# Patient Record
Sex: Male | Born: 1980 | Race: Asian | Hispanic: No | Marital: Married | Smoking: Never smoker
Health system: Southern US, Community
[De-identification: ages and names within clinical notes are randomized; demographics above are authoritative.]

## PROBLEM LIST (undated history)

## (undated) ENCOUNTER — Ambulatory Visit (HOSPITAL_COMMUNITY)

## (undated) DIAGNOSIS — K5900 Constipation, unspecified: Secondary | ICD-10-CM

---

## 2005-08-01 ENCOUNTER — Emergency Department (HOSPITAL_COMMUNITY): Admission: EM | Admit: 2005-08-01 | Discharge: 2005-08-01 | Payer: Self-pay | Admitting: Emergency Medicine

## 2014-04-29 ENCOUNTER — Ambulatory Visit (INDEPENDENT_AMBULATORY_CARE_PROVIDER_SITE_OTHER): Payer: BLUE CROSS/BLUE SHIELD | Admitting: Family Medicine

## 2014-04-29 VITALS — BP 118/80 | HR 56 | Temp 97.8°F | Resp 16 | Ht 64.25 in | Wt 162.2 lb

## 2014-04-29 DIAGNOSIS — L910 Hypertrophic scar: Secondary | ICD-10-CM

## 2014-04-29 NOTE — Patient Instructions (Signed)
I will refer you to a dermatologist for evaluation and treatment options for Keloids. See the handout for more information on this condition.  Return to the clinic or go to the nearest emergency room if any of your symptoms worsen or new symptoms occur.

## 2014-04-29 NOTE — Progress Notes (Addendum)
Subjective:   This chart was scribed for Meredith StaggersJeffrey Dwight Burdo, MD by Jarvis Morganaylor Ferguson, Medical Scribe. This patient was seen in Room 1 and the patient's care was started at 11:58 AM.   Patient ID: Michael DouglasGuh Stockman, male    DOB: 11/15/1980, 34 y.o.   MRN: [id number]  Chief Complaint  Patient presents with  . Growth on Both Shoulders    HPI   Michael Frank is a 34 y.o. male who presents to [facility] due to red, raised, painful bumps on his bilateral shoulders for over 2 years. He states that it never goes away and has been gradually worsening and has become itchy. Pt states that the areas have stayed the same size. 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 He denies any pain with movement of arms. He typically wears a harness at work and it goes across his shoulders. Pt denies the bumps anywhere else on his body. He denies any former wounds to those area of his shoulders. He denies arthralgias of bilateral shoulders.   PCP: No PCP Per Patient  There are no active problems to display for this patient.  History reviewed. No pertinent past medical history. History reviewed. No pertinent past surgical history. No Known Allergies Prior to Admission medications   Not on File   History   Social History  . Marital Status: Married    Spouse Name: N/A  . Number of Children: N/A  . Years of Education: N/A   Occupational History  . Not on file.   Social History Main Topics  . Smoking status: Never Smoker   . Smokeless tobacco: Never Used  . Alcohol Use: No  . Drug Use: No  . Sexual Activity: Not on file   Other Topics Concern  . Not on file   Social History Narrative  . No narrative on file    Review of Systems  Musculoskeletal: Negative for arthralgias.  Skin: Positive for color change.       red, raised, painful bumps to bilateral shoulders       Objective:   Physical Exam  Constitutional: He is oriented to person, place, and time. He appears well-developed  and well-nourished. No distress.  HENT:  Head: Normocephalic and atraumatic.  Eyes: Conjunctivae and EOM are normal.  Neck: Neck supple. No tracheal deviation present.  Cardiovascular: Normal rate, regular rhythm and normal heart sounds.  Exam reveals no gallop and no friction rub.   No murmur heard. Pulmonary/Chest: Effort normal and breath sounds normal. No respiratory distress.  Musculoskeletal: Normal range of motion.  Neurological: He is alert and oriented to person, place, and time.  Skin: Skin is warm and dry.  Keloid formations to bilateral shoulders. He has 2 on right shoulder and 1 on left shoulder. 2 areas on right shoulder measure at 2cm x 1cm  and 1 cm x 1 cm Left shoulder measures  2cm by 2.5 cm  2 small keloids on anterior chest wall measuring approx 1 cm by 0.5 cm  Psychiatric: He has a normal mood and affect. His behavior is normal.  Nursing note and vitals reviewed.   Filed Vitals:   04/29/14 1148  BP: 118/80  Pulse: 56  Temp: 97.8 F (36.6 C)  TempSrc: Oral  Resp: 16  Height: 5' 4.25" (1.632 m)  Weight: 162 lb 4 oz (73.596 kg)  SpO2: 100%      Assessment & Plan:  Michael DouglasGuh Clemenson is a 34 y.o. male Keloid -  Plan: Ambulatory referral to Dermatology longstanding issue, but irritation of area using harness at work. Refer to dermatology for eval to see if removal or intralesion corticosteroids may be option. understanding expressed (daughter translating). Handout from [employer].     No orders of the defined types were placed in this encounter.   Patient Instructions  I will refer you to a dermatologist for evaluation and treatment options for Keloids. See the handout for more information on this condition.  Return to the clinic or go to the nearest emergency room if any of your symptoms worsen or new symptoms occur.         I personally performed the services described in this documentation, which was scribed in my presence. The recorded information has been  reviewed and considered, and addended by me as needed.

## 2017-05-14 ENCOUNTER — Ambulatory Visit (HOSPITAL_COMMUNITY)
Admission: EM | Admit: 2017-05-14 | Discharge: 2017-05-14 | Disposition: A | Discharge status: Home / Self Care | Payer: PRIVATE HEALTH INSURANCE | Attending: Family Medicine | Admitting: Family Medicine

## 2017-05-14 ENCOUNTER — Encounter (HOSPITAL_COMMUNITY): Payer: Self-pay | Admitting: Emergency Medicine

## 2017-05-14 ENCOUNTER — Other Ambulatory Visit: Payer: Self-pay

## 2017-05-14 DIAGNOSIS — K5900 Constipation, unspecified: Secondary | ICD-10-CM

## 2017-05-14 LAB — POCT URINALYSIS DIP (DEVICE)
BILIRUBIN URINE: NEGATIVE
Glucose, UA: NEGATIVE mg/dL
HGB URINE DIPSTICK: NEGATIVE
KETONES UR: NEGATIVE mg/dL
Leukocytes, UA: NEGATIVE
Nitrite: NEGATIVE
Protein, ur: NEGATIVE mg/dL
SPECIFIC GRAVITY, URINE: 1.02 (ref 1.005–1.030)
Urobilinogen, UA: 0.2 mg/dL (ref 0.0–1.0)
pH: 7 (ref 5.0–8.0)

## 2017-05-14 MED ORDER — LACTULOSE 10 GM/15ML PO SOLN: 10.0000 g | Freq: Every day | ORAL | 0 refills | Status: DC | PRN

## 2017-05-14 NOTE — ED Triage Notes (Signed)
C/o blood in urine and dysuria onset over a year

## 2017-05-14 NOTE — ED Provider Notes (Addendum)
[facility]    CSN: [id number] Arrival date & time: 05/14/17  1040     History   Chief Complaint Chief Complaint  Patient presents with  . Hematuria    HPI Michael Frank is a 37 y.o. male.   Subjective:   Michael DouglasGuh Creps is a 37 y.o. male who presents for evaluation of constipation. Patient has been having blood tinged, bloody coated and pellet like stools intermittently for the past year. Defecation has been difficult at times. Patient denies any abdominal pain, nausea, vomiting, abdominal distention or rectal pain. Co-Morbid conditions: none. Symptoms have been intermittent. Current Health Habits: Eating fiber? Not regularly.  Exercise? No. Adequate hydration? Yes. Current over the counter/prescription laxative: None   The following portions of the patient's history were reviewed and updated as appropriate: allergies, current medications, past family history, past medical history, past social history, past surgical history and problem list.          History reviewed. No pertinent past medical history.  There are no active problems to display for this patient.   History reviewed. No pertinent surgical history.     Home Medications    Prior to Admission medications   Not on File    Family History No family history on file.  Social History Social History   Tobacco Use  . Smoking status: Never Smoker  . Smokeless tobacco: Never Used  Substance Use Topics  . Alcohol use: No    Alcohol/week: 0.0 oz  . Drug use: No     Allergies   Patient has no known allergies.   Review of Systems Review of Systems  Gastrointestinal: Positive for blood in stool and constipation. Negative for abdominal distention, abdominal pain, anal bleeding, diarrhea, nausea, rectal pain and vomiting.  All other systems reviewed and are negative.    Physical Exam Triage Vital Signs ED Triage Vitals  Enc Vitals Group     BP 05/14/17 1117 129/89     Pulse Rate 05/14/17 1117  (!) 58     Resp --      Temp 05/14/17 1117 98.3 F (36.8 C)     Temp Source 05/14/17 1117 Oral     SpO2 05/14/17 1117 98 %     Weight --      Height --      Head Circumference --      Peak Flow --      Pain Score 05/14/17 1115 0     Pain Loc --      Pain Edu? --      Excl. in GC? --    No data found.  Updated Vital Signs BP 129/89 (BP Location: Right Arm)   Pulse (!) 58   Temp 98.3 F (36.8 C) (Oral)   SpO2 98%   Visual Acuity Right Eye Distance:   Left Eye Distance:   Bilateral Distance:    Right Eye Near:   Left Eye Near:    Bilateral Near:     Physical Exam  Constitutional: He appears well-developed and well-nourished.  Neck: Normal range of motion. Neck supple.  Cardiovascular: Normal rate and regular rhythm.  Pulmonary/Chest: Effort normal and breath sounds normal.  Abdominal: Soft. Bowel sounds are normal. He exhibits no distension. There is no tenderness. There is no rebound and no guarding.  Rectal Exam: Anus without any lesions. Normal rectal tone without any massess. Soft brown stool in rectal vault.      UC Treatments / Results  Labs (all labs ordered  are listed, but only abnormal results are displayed) Labs Reviewed  POCT URINALYSIS DIP (DEVICE)    EKG  EKG Interpretation None       Radiology No results found.  Procedures Procedures (including critical care time)  Medications Ordered in UC Medications - No data to display   Initial Impression / Assessment and Plan / UC Course  I have reviewed the triage vital signs and the nursing notes.  Pertinent labs & imaging results that were available during my care of the patient were reviewed by me and considered in my medical decision making (see chart for details).  Clinical Course as of May 14 1156  Thu May 14, 2017  1139 Ketones, ur: NEGATIVE [BH]    Clinical Course User Index [BH] Mardella Layman, MD     37 year old male with a one-year history of intermittent constipation with  occasional blood tinge, pellet-like stools. No abdominal pain, nausea or vomiting. Rectal exam unremarkable. Provided education about constipation causes and treatment discussed. Provided patient with list of foods with high content of dietary fiber. Lactulose PRN. Increase hydration. Follow up with GI if symptoms do not improve. Discussed diagnosis and treatment with patient. All questions have been answered and all concerns have been addressed. The patient verbalized understanding and had no further questions.   Final Clinical Impressions(s) / UC Diagnoses   Final diagnoses:  Constipation, unspecified constipation type    ED Discharge Orders    None       Controlled Substance Prescriptions [place] Controlled Substance Registry consulted? Not Applicable   Lurline Idol, FNP 05/14/17 1212    Lurline Idol, [place] 05/14/17 1213

## 2017-10-10 ENCOUNTER — Ambulatory Visit (HOSPITAL_COMMUNITY)
Admission: EM | Admit: 2017-10-10 | Discharge: 2017-10-10 | Disposition: A | Discharge status: Home / Self Care | Payer: PRIVATE HEALTH INSURANCE | Attending: Family Medicine | Admitting: Family Medicine

## 2017-10-10 ENCOUNTER — Encounter (HOSPITAL_COMMUNITY): Payer: Self-pay | Admitting: Emergency Medicine

## 2017-10-10 ENCOUNTER — Other Ambulatory Visit: Payer: Self-pay

## 2017-10-10 DIAGNOSIS — K5909 Other constipation: Secondary | ICD-10-CM | POA: Diagnosis not present

## 2017-10-10 LAB — OCCULT BLOOD, POC DEVICE: FECAL OCCULT BLD: NEGATIVE

## 2017-10-10 MED ORDER — LACTULOSE 10 GM/15ML PO SOLN: 10.0000 g | Freq: Every day | ORAL | 0 refills | Status: DC | PRN

## 2017-10-10 NOTE — ED Provider Notes (Signed)
[facility]   [id number] 10/10/17 Arrival Time: 1151  CC: Rectal bleeding  Hx obtained from patient's relative.  Declines translator.    SUBJECTIVE:  Michael Frank is a 37 y.o. male who presents with complaint of one episode of blood on stool and constipation that occurred x1 week.  Symptoms occurred after having a bowel movement.  Has tried laculose in the past with relief.  Worse with having a bowel movement.  Reports similar symptoms in the past.  Last BM last week.    Denies fever, chills, appetite changes, weight changes, nausea, vomiting, chest pain, SOB, diarrhea, hematochezia, melena, dysuria, difficulty urinating, increased frequency or urgency, flank pain, loss of bowel or bladder function.  No LMP for male patient.  ROS: As per HPI.  History reviewed. No pertinent past medical history. History reviewed. No pertinent surgical history. No Known Allergies No current facility-administered medications on file prior to encounter.    No current outpatient medications on file prior to encounter.   Social History   Socioeconomic History  . Marital status: Married    Spouse name: Not on file  . Number of children: Not on file  . Years of education: Not on file  . Highest education level: Not on file  Occupational History  . Not on file  Social Needs  . Financial resource strain: Not on file  . Food insecurity:    Worry: Not on file    Inability: Not on file  . Transportation needs:    Medical: Not on file    Non-medical: Not on file  Tobacco Use  . Smoking status: Never Smoker  . Smokeless tobacco: Never Used  Substance and Sexual Activity  . Alcohol use: No    Alcohol/week: 0.0 standard drinks  . Drug use: No  . Sexual activity: Not on file  Lifestyle  . Physical activity:    Days per week: Not on file    Minutes per session: Not on file  . Stress: Not on file  Relationships  . Social connections:    Talks on phone: Not on file    Gets together: Not  on file    Attends religious service: Not on file    Active member of club or organization: Not on file    Attends meetings of clubs or organizations: Not on file    Relationship status: Not on file  . Intimate partner violence:    Fear of current or ex partner: Not on file    Emotionally abused: Not on file    Physically abused: Not on file    Forced sexual activity: Not on file  Other Topics Concern  . Not on file  Social History Narrative  . Not on file   History reviewed. No pertinent family history.   OBJECTIVE:  Vitals:   10/10/17 1233  BP: 121/89  Pulse: (!) 59  Resp: 18  Temp: 98.4 F (36.9 C)  TempSrc: Oral  SpO2: 100%    General appearance: AOx3 in no acute distress HEENT: NCAT.  Oropharynx clear.  Lungs: clear to auscultation bilaterally without adventitious breath sounds Heart: regular rate and rhythm.  Radial pulses 2+ symmetrical bilaterally Abdomen: soft, non-distended; normal active bowel sounds; non-tender to light and deep palpation; nontender at McBurney's point; negative Murphy's sign; negative rebound; no guarding External exam negative for external hemorrhoid, no obvious masses or fissures appreciated.  Internal: Mild discomfort; no masses appreciated; hemoccult negative  Extremities: no edema; symmetrical with no gross deformities Skin: warm  and dry Neurologic: normal gait Psychological: alert and cooperative; normal mood and affect  Labs: Results for orders placed or performed during the hospital encounter of 10/10/17 (from the past 24 hour(s))  Occult blood, poc device     Status: None   Collection Time: 10/10/17  1:15 PM  Result Value Ref Range   Fecal Occult Bld NEGATIVE NEGATIVE     ASSESSMENT & PLAN:  1. Other constipation     Meds ordered this encounter  Medications  . lactulose (CHRONULAC) 10 GM/15ML solution    Sig: Take 15 mLs (10 g total) by mouth daily as needed for mild constipation.    Dispense:  240 mL    Refill:  0     Order Specific Question:   Supervising Provider    Answer:   Isa RankinMURRAY, LAURA WILSON [[id number]]   Hemoccult card did not show blood in stool We will treat you for constipation Lactulose prescribed.  Take as directed for symptomatic relief Increase water intake as well as fiber rich foods Follow up with PCP or [facility] if symptoms persists Return or go to the ED if you have any new or worsening symptoms  Reviewed expectations re: course of current medical issues. Questions answered. Outlined signs and symptoms indicating need for more acute intervention. Patient verbalized understanding. After Visit Summary given.   Rennis HardingWurst, Alynna Hargrove, PA-C 10/10/17 1432

## 2017-10-10 NOTE — ED Triage Notes (Signed)
Patient reports blood in stool.  Patient has been constipated and has noticed red blood.  Patient has a history of this prior to today and has come to ucc for treatment

## 2017-10-10 NOTE — Discharge Instructions (Addendum)
Hemoccult card did not show blood in stool We will treat you for constipation Lactulose prescribed.  Take as directed for symptomatic relief Increase water intake as well as fiber rich foods Follow up with PCP or [facility] if symptoms persists Return or go to the ED if you have any new or worsening symptoms

## 2020-07-28 ENCOUNTER — Ambulatory Visit (HOSPITAL_COMMUNITY)
Admission: EM | Admit: 2020-07-28 | Discharge: 2020-07-28 | Disposition: A | Discharge status: Home / Self Care | Payer: PRIVATE HEALTH INSURANCE | Attending: Student | Admitting: Student

## 2020-07-28 ENCOUNTER — Encounter (HOSPITAL_COMMUNITY): Payer: Self-pay

## 2020-07-28 ENCOUNTER — Other Ambulatory Visit: Payer: Self-pay

## 2020-07-28 ENCOUNTER — Ambulatory Visit (INDEPENDENT_AMBULATORY_CARE_PROVIDER_SITE_OTHER): Payer: PRIVATE HEALTH INSURANCE

## 2020-07-28 DIAGNOSIS — R0989 Other specified symptoms and signs involving the circulatory and respiratory systems: Secondary | ICD-10-CM | POA: Diagnosis not present

## 2020-07-28 DIAGNOSIS — R198 Other specified symptoms and signs involving the digestive system and abdomen: Secondary | ICD-10-CM

## 2020-07-28 MED ORDER — SUCRALFATE 1 GM/10ML PO SUSP: 1.0000 g | Freq: Three times a day (TID) | ORAL | 0 refills | Status: DC | PRN

## 2020-07-28 NOTE — ED Provider Notes (Signed)
[facility]    CSN: [id number] Arrival date & time: 07/28/20  1301      History   Chief Complaint Chief Complaint  Patient presents with  . Foreign Body    Fish bone     HPI Michael Frank is a 40 y.o. male presenting for concern for fish bone stuck in throat.  Medical history noncontributory.  States he was eating fish 1 day ago when he swallowed a fishbone.  States it still feels like it is stuck in his throat.  Has tried to disimpact this using eating rice, but this has not helped.  Still endorses the sensation of a fishbone stuck on the left side of his throat.  Denies shortness of breath, chest pain, dizziness, weakness, fevers/chills, cough.  HPI  History reviewed. No pertinent past medical history.  There are no problems to display for this patient.   History reviewed. No pertinent surgical history.     Home Medications    Prior to Admission medications   Medication Sig Start Date End Date Taking? Authorizing Provider  sucralfate (CARAFATE) 1 GM/10ML suspension Take 10 mLs (1 g total) by mouth 3 (three) times daily as needed. 07/28/20  Yes Rhys Martini, PA-C  lactulose (CHRONULAC) 10 GM/15ML solution Take 15 mLs (10 g total) by mouth daily as needed for mild constipation. 10/10/17   Rennis Harding, PA-C    Family History History reviewed. No pertinent family history.  Social History Social History   Tobacco Use  . Smoking status: Never Smoker  . Smokeless tobacco: Never Used  Substance Use Topics  . Alcohol use: No    Alcohol/week: 0.0 standard drinks  . Drug use: No     Allergies   Patient has no known allergies.   Review of Systems Review of Systems  HENT:       Globus sensation  All other systems reviewed and are negative.    Physical Exam Triage Vital Signs ED Triage Vitals  Enc Vitals Group     BP 07/28/20 1409 133/86     Pulse Rate 07/28/20 1409 66     Resp 07/28/20 1409 16     Temp 07/28/20 1409 98.2 F (36.8 C)     Temp  Source 07/28/20 1409 Oral     SpO2 07/28/20 1409 100 %     Weight --      Height --      Head Circumference --      Peak Flow --      Pain Score 07/28/20 1408 5     Pain Loc --      Pain Edu? --      Excl. in GC? --    No data found.  Updated Vital Signs BP 133/86 (BP Location: Right Arm)   Pulse 66   Temp 98.2 F (36.8 C) (Oral)   Resp 16   SpO2 100%   Visual Acuity Right Eye Distance:   Left Eye Distance:   Bilateral Distance:    Right Eye Near:   Left Eye Near:    Bilateral Near:     Physical Exam Vitals reviewed.  Constitutional:      General: He is not in acute distress.    Appearance: Normal appearance. He is not ill-appearing or diaphoretic.  HENT:     Head: Normocephalic and atraumatic.     Mouth/Throat:     Pharynx: Uvula midline. No posterior oropharyngeal erythema or uvula swelling.     Tonsils: No tonsillar exudate.  Comments: No visible fish bone  On exam, uvula is midline, he is tolerating secretions without difficulty, there is no trismus, no drooling, he has normal phonation  Cardiovascular:     Rate and Rhythm: Normal rate and regular rhythm.     Heart sounds: Normal heart sounds.  Pulmonary:     Effort: Pulmonary effort is normal.     Breath sounds: Normal breath sounds.  Lymphadenopathy:     Cervical: No cervical adenopathy.  Skin:    General: Skin is warm.  Neurological:     General: No focal deficit present.     Mental Status: He is alert and oriented to person, place, and time.  Psychiatric:        Mood and Affect: Mood normal.        Behavior: Behavior normal.        Thought Content: Thought content normal.        Judgment: Judgment normal.      UC Treatments / Results  Labs (all labs ordered are listed, but only abnormal results are displayed) Labs Reviewed - No data to display  EKG   Radiology DG Neck Soft Tissue  Result Date: 07/28/2020 CLINICAL DATA:  Concern for fish bone stuck in throat. EXAM: NECK SOFT  TISSUES - 1+ VIEW COMPARISON:  None. FINDINGS: There is no evidence of retropharyngeal soft tissue swelling or epiglottic enlargement. The cervical airway is unremarkable and no radio-opaque foreign body identified. IMPRESSION: Negative. Electronically Signed   By: Bary Richard M.D.   On: 07/28/2020 16:05    Procedures Procedures (including critical care time)  Medications Ordered in UC Medications - No data to display  Initial Impression / Assessment and Plan / UC Course  I have reviewed the triage vital signs and the nursing notes.  Pertinent labs & imaging results that were available during my care of the patient were reviewed by me and considered in my medical decision making (see chart for details).     This patient is a 40 year old male presenting with concern for foreign body in throat (fishbone).  Afebrile nontachycardic, nontachypneic.  Oxygenating well on room air without tripoding or respiratory distress.  Sucralfate sent as below. For symptomatic relief.  Xray soft tissue neck negative for foreign body.  ED return precautions discussed.   Pt declines translation services as he speaks [place].  Final Clinical Impressions(s) / UC Diagnoses   Final diagnoses:  Globus sensation     Discharge Instructions     -The x-ray did not show any foreign bodies or fish bones in your throat. -For the sensation of something stuck in your throat, start the medication-sucralfate (Carafate) syrup, up to 3 times daily. -Seek additional medical attention if you develop new symptoms like shortness of breath, chest pain, difficulty swallowing, dizziness.    ED Prescriptions    Medication Sig Dispense Auth. Provider   sucralfate (CARAFATE) 1 GM/10ML suspension Take 10 mLs (1 g total) by mouth 3 (three) times daily as needed. 100 mL Rhys Martini, PA-C     PDMP not reviewed this encounter.   Rhys Martini, PA-C 07/28/20 1644

## 2020-07-28 NOTE — ED Notes (Signed)
Pt declined offer for [employer]

## 2020-07-28 NOTE — Discharge Instructions (Signed)
-  The x-ray did not show any foreign bodies or fish bones in your throat. -For the sensation of something stuck in your throat, start the medication-sucralfate (Carafate) syrup, up to 3 times daily. -Seek additional medical attention if you develop new symptoms like shortness of breath, chest pain, difficulty swallowing, dizziness.

## 2020-07-28 NOTE — ED Triage Notes (Signed)
Pt present fish bone stuck in his throat and cannot get it out.  Pt states that it hurts to swallow and he attempted to eat rice for it to go down but no relief.

## 2020-11-04 ENCOUNTER — Other Ambulatory Visit: Payer: Self-pay

## 2020-11-04 ENCOUNTER — Encounter (HOSPITAL_COMMUNITY): Payer: Self-pay | Admitting: *Deleted

## 2020-11-04 ENCOUNTER — Ambulatory Visit (HOSPITAL_COMMUNITY)
Admission: EM | Admit: 2020-11-04 | Discharge: 2020-11-04 | Disposition: A | Discharge status: Home / Self Care | Payer: PRIVATE HEALTH INSURANCE | Attending: Emergency Medicine | Admitting: Emergency Medicine

## 2020-11-04 DIAGNOSIS — K5900 Constipation, unspecified: Secondary | ICD-10-CM | POA: Diagnosis not present

## 2020-11-04 DIAGNOSIS — R198 Other specified symptoms and signs involving the digestive system and abdomen: Secondary | ICD-10-CM

## 2020-11-04 HISTORY — DX: Constipation, unspecified: K59.00

## 2020-11-04 MED ORDER — GLYCERIN (ADULT) 2 G RE SUPP: 1.0000 | Freq: Once | RECTAL | 0 refills | Status: DC | PRN

## 2020-11-04 MED ORDER — POLYETHYLENE GLYCOL 3350 17 GM/SCOOP PO POWD: 17.0000 g | Freq: Every day | ORAL | 0 refills | Status: DC

## 2020-11-04 NOTE — Discharge Instructions (Addendum)
Drink extra fluids. It may take up to 3 days for the miralax to take effect. may also drink prune and apple juice.  The glycerin suppositories will soften your stool so that it does not hurt as much when you defecate.  Return to the ER if you have a fever, if you start having severe abdominal pain, a fever >100.4, or any other concerns.   Below is a list of primary care practices who are taking new patients for you to follow-up with.  [facility] internal medicine clinic Ground Floor - [facility], [address] [place], [place], [place] [address] [phone]  [facility] Primary Care at [facility] [address] [place], [place] [address] [phone]  [facility] [address] [place], [place] [address] [phone]  [facility] [phone] [address] [place] [address]  [facility]: [address] [place] [address]  [phone]  [facility]: [address] [place] [address]  [phone]  [facility] : [address] [place] [address] [phone]  [facility] Primary Care: [address] [place] [address] [phone]  [facility]: [address] [place] [address] [phone]  Dr. Oneal Grout [address] [facility] [address] [place] [address]  [phone]  Go to www.goodrx.com  or www.costplusdrugs.com to look up your medications. This will give you a list of where you can find your prescriptions at the most affordable prices. Or ask the pharmacist what the cash price is, or if they have any other discount programs available to help make your medication more affordable. This can be less expensive than what you would pay with insurance.

## 2020-11-04 NOTE — ED Triage Notes (Signed)
Via family member.: Pt c/o constipation.  Reports most recent BM last night, but describes painful, hard stools.  Denies any blood in stool.

## 2020-11-04 NOTE — ED Provider Notes (Signed)
HPI  SUBJECTIVE:  Michael Frank is a 40 y.o. male who presents with constipation and pain with defecation for "a long time".  He estimates that it has been 8 years or more.  He states that he has pain with defecation every time he has a bowel movement.  He states that his stool is hard.  He had a normal bowel movement yesterday-states it was normal in color and amount.  He does report some rectal pruritus.  No rectal bleeding, perirectal masses or lumps, nausea, fever, abdominal pain, abdominal distention, rectal discharge, drainage, foreign body insertion.  He states that he drinks 10 500 mL of bottles of water a day.  He intermittently eats fruits and vegetables.  He has not tried anything for this, no alleviating factors.  Symptoms are worse with defecation.  He has been seen here for this twice before and was treated with lactulose with improvement in his symptoms.  He has no past medical history.  PMD: None.  All history obtained through his son, acting as Nurse, learning disability.  Offered to use video translator twice, patient refused both times.    Past Medical History:  Diagnosis Date   Constipation     History reviewed. No pertinent surgical history.  History reviewed. No pertinent family history.  Social History   Tobacco Use   Smoking status: Never   Smokeless tobacco: Never  Vaping Use   Vaping Use: Never used  Substance Use Topics   Alcohol use: Yes    Comment: occasionally   Drug use: No    No current facility-administered medications for this encounter.  Current Outpatient Medications:    glycerin adult 2 g suppository, Place 1 suppository rectally once as needed (constipation)., Disp: 12 suppository, Rfl: 0   polyethylene glycol powder (GLYCOLAX/MIRALAX) 17 GM/SCOOP powder, Take 17 g by mouth daily., Disp: 255 g, Rfl: 0  No Known Allergies   ROS  As noted in HPI.   Physical Exam  BP (!) 149/61   Pulse 60   Temp 98.5 F (36.9 C) (Oral)   Resp 16   SpO2 100%    Constitutional: Well developed, well nourished, no acute distress Eyes:  EOMI, conjunctiva normal bilaterally HENT: Normocephalic, atraumatic,mucus membranes moist Respiratory: Normal inspiratory effort Cardiovascular: Normal rate GI: Normal appearance, soft, nontender, nondistended.  No rebound, guarding.  Active bowel sounds. Rectal: Normal external appearance.  Soft skin tag at 12:00.  No hemorrhoids.  Normal rectal tone.  Minimal soft stool in the vault.  No impaction. skin: No rash, skin intact Musculoskeletal: no deformities Neurologic: Alert & oriented x 3, no focal neuro deficits Psychiatric: Speech and behavior appropriate   ED Course   Medications - No data to display  No orders of the defined types were placed in this encounter.   No results found for this or any previous visit (from the past 24 hour(s)). No results found.  ED Clinical Impression  1. Constipation, unspecified constipation type   2. Pain associated with defecation      ED Assessment/Plan  Patient with persistent constipation.  He has been treated successfully with lactulose in the past.  We can try some glycerin suppositories, MiraLAX.  Advised regular fiber intake.  Will provide primary care list and order assistance in finding a PMD for routine care.  Discussed lMDM, treatment plan, and plan for follow-up with patient.  patient agrees with plan.   Meds ordered this encounter  Medications   glycerin adult 2 g suppository    Sig: Place  1 suppository rectally once as needed (constipation).    Dispense:  12 suppository    Refill:  0   polyethylene glycol powder (GLYCOLAX/MIRALAX) 17 GM/SCOOP powder    Sig: Take 17 g by mouth daily.    Dispense:  255 g    Refill:  0      *This clinic note was created using Scientist, clinical (histocompatibility and immunogenetics). Therefore, there may be occasional mistakes despite careful proofreading.  ?    Domenick Gong, MD 11/05/20 1011

## 2022-04-26 ENCOUNTER — Ambulatory Visit (INDEPENDENT_AMBULATORY_CARE_PROVIDER_SITE_OTHER): Payer: 59

## 2022-04-26 ENCOUNTER — Ambulatory Visit (HOSPITAL_COMMUNITY)
Admission: EM | Admit: 2022-04-26 | Discharge: 2022-04-26 | Disposition: A | Discharge status: Home / Self Care | Payer: 59 | Attending: Nurse Practitioner | Admitting: Nurse Practitioner

## 2022-04-26 ENCOUNTER — Encounter (HOSPITAL_COMMUNITY): Payer: Self-pay

## 2022-04-26 DIAGNOSIS — M79604 Pain in right leg: Secondary | ICD-10-CM

## 2022-04-26 DIAGNOSIS — M25571 Pain in right ankle and joints of right foot: Secondary | ICD-10-CM

## 2022-04-26 NOTE — Discharge Instructions (Signed)
Overall your x-ray is negative for any acute abnormalities.  We do encourage you to follow up with your primary care provider if your symptoms persist for further evaluation with additional imaging. We do recommend that you use over the counter Tylenol or Ibuprofen as directed (do not exceed daily limits).

## 2022-04-26 NOTE — ED Provider Notes (Signed)
[facility]    CSN: [id number] Arrival date & time: 04/26/22  1555      History   Chief Complaint Chief Complaint  Patient presents with   Leg Pain    HPI Michael Frank is a 42 y.o. male.   HPI  Past Medical History:  Diagnosis Date   Constipation     There are no problems to display for this patient.   History reviewed. No pertinent surgical history.     Home Medications    Prior to Admission medications   Not on File    Family History History reviewed. No pertinent family history.  Social History Social History   Tobacco Use   Smoking status: Never   Smokeless tobacco: Never  Vaping Use   Vaping Use: Never used  Substance Use Topics   Alcohol use: Yes    Comment: occasionally   Drug use: No     Allergies   Patient has no known allergies.   Review of Systems Review of Systems   Physical Exam Triage Vital Signs ED Triage Vitals  Enc Vitals Group     BP 04/26/22 1700 128/75     Pulse Rate 04/26/22 1658 (!) 56     Resp 04/26/22 1658 14     Temp 04/26/22 1658 97.6 F (36.4 C)     Temp Source 04/26/22 1658 Oral     SpO2 04/26/22 1658 98 %     Weight 04/26/22 1657 170 lb (77.1 kg)     Height --      Head Circumference --      Peak Flow --      Pain Score 04/26/22 1657 7     Pain Loc --      Pain Edu? --      Excl. in [place]? --    No data found.  Updated Vital Signs BP 128/75 (BP Location: Right Arm)   Pulse (!) 56   Temp 97.6 F (36.4 C) (Oral)   Resp 14   Wt 170 lb (77.1 kg)   SpO2 98%   BMI 28.95 kg/m   Visual Acuity Right Eye Distance:   Left Eye Distance:   Bilateral Distance:    Right Eye Near:   Left Eye Near:    Bilateral Near:     Physical Exam   UC Treatments / Results  Labs (all labs ordered are listed, but only abnormal results are displayed) Labs Reviewed - No data to display  EKG   Radiology DG Tibia/Fibula Right  Result Date: 04/26/2022 CLINICAL DATA:  Right ankle pain EXAM: RIGHT TIBIA  AND FIBULA - 2 VIEW COMPARISON:  None Available. FINDINGS: No fracture or dislocation is seen. The joint spaces are preserved. Visualized soft tissues are within normal limits. IMPRESSION: Negative. Electronically Signed   By: Julian Hy M.D.   On: 04/26/2022 18:18    Procedures Procedures (including critical care time)  Medications Ordered in UC Medications - No data to display  Initial Impression / Assessment and Plan / UC Course  I have reviewed the triage vital signs and the nursing notes.  Pertinent labs & imaging results that were available during my care of the patient were reviewed by me and considered in my medical decision making (see chart for details).     Right leg pain Final Clinical Impressions(s) / UC Diagnoses   Final diagnoses:  Right leg pain     Discharge Instructions      Overall your x-ray is negative  for any acute abnormalities.  We do encourage you to follow up with your primary care provider if your symptoms persist for further evaluation with additional imaging. We do recommend that you use over the counter Tylenol or Ibuprofen as directed (do not exceed daily limits).      ED Prescriptions   None    PDMP not reviewed this encounter.

## 2022-04-26 NOTE — ED Triage Notes (Signed)
Pt states that his right ankle hurts x2 months. Hasn't taken anything for the pain.

## 2022-04-30 ENCOUNTER — Emergency Department (HOSPITAL_COMMUNITY)
Admission: EM | Admit: 2022-04-30 | Discharge: 2022-05-01 | Disposition: A | Discharge status: Home / Self Care | Payer: 59 | Attending: Emergency Medicine | Admitting: Emergency Medicine

## 2022-04-30 ENCOUNTER — Other Ambulatory Visit: Payer: Self-pay

## 2022-04-30 ENCOUNTER — Encounter (HOSPITAL_COMMUNITY): Payer: Self-pay | Admitting: *Deleted

## 2022-04-30 DIAGNOSIS — R519 Headache, unspecified: Secondary | ICD-10-CM | POA: Insufficient documentation

## 2022-04-30 DIAGNOSIS — R42 Dizziness and giddiness: Secondary | ICD-10-CM | POA: Diagnosis present

## 2022-04-30 LAB — CBC
HCT: 45.6 % (ref 39.0–52.0)
Hemoglobin: 14.8 g/dL (ref 13.0–17.0)
MCH: 23 pg — ABNORMAL LOW (ref 26.0–34.0)
MCHC: 32.5 g/dL (ref 30.0–36.0)
MCV: 70.8 fL — ABNORMAL LOW (ref 80.0–100.0)
Platelets: 333 10*3/uL (ref 150–400)
RBC: 6.44 MIL/uL — ABNORMAL HIGH (ref 4.22–5.81)
RDW: 15 % (ref 11.5–15.5)
WBC: 8.4 10*3/uL (ref 4.0–10.5)
nRBC: 0 % (ref 0.0–0.2)

## 2022-04-30 LAB — COMPREHENSIVE METABOLIC PANEL
ALT: 23 U/L (ref 0–44)
AST: 32 U/L (ref 15–41)
Albumin: 4.2 g/dL (ref 3.5–5.0)
Alkaline Phosphatase: 65 U/L (ref 38–126)
Anion gap: 13 (ref 5–15)
BUN: 10 mg/dL (ref 6–20)
CO2: 22 mmol/L (ref 22–32)
Calcium: 9.6 mg/dL (ref 8.9–10.3)
Chloride: 103 mmol/L (ref 98–111)
Creatinine, Ser: 1.01 mg/dL (ref 0.61–1.24)
GFR, Estimated: >60 mL/min (ref >60)
Glucose, Bld: 95 mg/dL (ref 70–99)
Potassium: 3.6 mmol/L (ref 3.5–5.1)
Sodium: 138 mmol/L (ref 135–145)
Total Bilirubin: 0.6 mg/dL (ref 0.3–1.2)
Total Protein: 7.9 g/dL (ref 6.5–8.1)

## 2022-04-30 LAB — LIPASE, BLOOD: Lipase: 33 U/L (ref 11–51)

## 2022-04-30 NOTE — ED Triage Notes (Signed)
The pt is c/odizziness all day  and he feels nauseated  no fever

## 2022-05-01 ENCOUNTER — Emergency Department (HOSPITAL_COMMUNITY): Payer: 59

## 2022-05-01 MED ORDER — MECLIZINE HCL 25 MG PO TABS: 25.0000 mg | ORAL_TABLET | Freq: Three times a day (TID) | ORAL | 0 refills | Status: DC | PRN

## 2022-05-01 MED ORDER — METOCLOPRAMIDE HCL 5 MG/ML IJ SOLN
10.0000 mg | Freq: Once | INTRAMUSCULAR | Status: AC
  Administered 2022-05-01: 10 mg via INTRAVENOUS
  Filled 2022-05-01: qty 2

## 2022-05-01 MED ORDER — SODIUM CHLORIDE 0.9 % IV BOLUS
500.0000 mL | Freq: Once | INTRAVENOUS | Status: AC
  Administered 2022-05-01: 500 mL via INTRAVENOUS

## 2022-05-01 MED ORDER — MECLIZINE HCL 25 MG PO TABS
25.0000 mg | ORAL_TABLET | Freq: Once | ORAL | Status: AC
  Administered 2022-05-01: 25 mg via ORAL
  Filled 2022-05-01: qty 1

## 2022-05-01 NOTE — Discharge Instructions (Signed)
Lab work imaging were reassuring given you medication called meclizine take as prescribed for dizziness.  If you have headache I recommend ibuprofen Tylenol.  Please stay hydrated get plenty of sleep decrease in stress this will help limit future headaches  Follow-up with your primary care doctor and or neurology.  Come back to the emergency department if you develop chest pain, shortness of breath, severe abdominal pain, uncontrolled nausea, vomiting, diarrhea.

## 2022-05-01 NOTE — ED Provider Notes (Signed)
[facility] Provider Note   CSN: [id number] Arrival date & time: 04/30/22  2219     History  Chief Complaint  Patient presents with   Dizziness    Michael Frank is a 42 y.o. male.  HPI   Without significant medical history presented complaints of a headache and dizziness.  Started yesterday, came on around 5 PM, came on suddenly, states he feels a slight pressure in the front of his head, and felt dizzy once, describes as if the room was spinning.  He had no change in vision no numbness or tingling arms or legs.  States that he has not had this before, currently has a slight headache this time without any dizziness, denies any head trauma, not on anticoag, not endorse any fever chills cough congestion.  He has no other complaints.    Home Medications Prior to Admission medications   Medication Sig Start Date End Date Taking? Authorizing Provider  meclizine (ANTIVERT) 25 MG tablet Take 1 tablet (25 mg total) by mouth 3 (three) times daily as needed for dizziness. 05/01/22  Yes Marcello Fennel, PA-C      Allergies    Patient has no known allergies.    Review of Systems   Review of Systems  Constitutional:  Negative for chills and fever.  Respiratory:  Negative for shortness of breath.   Cardiovascular:  Negative for chest pain.  Gastrointestinal:  Negative for abdominal pain.  Neurological:  Positive for dizziness and headaches.    Physical Exam Updated Vital Signs BP 129/86   Pulse 61   Temp 98 F (36.7 C)   Resp 18   Ht '5\' 4"'$  (1.626 m)   Wt 77.1 kg   SpO2 99%   BMI 29.18 kg/m  Physical Exam Vitals and nursing note reviewed.  Constitutional:      General: He is not in acute distress.    Appearance: He is not ill-appearing.  HENT:     Head: Normocephalic and atraumatic.     Ears:     Comments: No deformity of the head present    Nose: No congestion.     Mouth/Throat:     Mouth: Mucous membranes are moist.      Pharynx: Oropharynx is clear.     Comments: No trismus no torticollis no oral trauma Eyes:     Conjunctiva/sclera: Conjunctivae normal.  Cardiovascular:     Rate and Rhythm: Normal rate and regular rhythm.     Pulses: Normal pulses.     Heart sounds: No murmur heard.    No friction rub. No gallop.  Pulmonary:     Effort: No respiratory distress.     Breath sounds: No wheezing, rhonchi or rales.  Skin:    General: Skin is warm and dry.  Neurological:     Mental Status: He is alert.     GCS: GCS eye subscore is 4. GCS verbal subscore is 5. GCS motor subscore is 6.     Cranial Nerves: Cranial nerves 2-12 are intact.     Motor: No weakness.     Coordination: Romberg sign negative. Finger-Nose-Finger Test normal.     Comments: No facial asymmetry no difficulty with word finding following two-step commands there is no unilateral weakness present.  Psychiatric:        Mood and Affect: Mood normal.     ED Results / Procedures / Treatments   Labs (all labs ordered are listed, but only abnormal results  are displayed) Labs Reviewed  CBC - Abnormal; Notable for the following components:      Result Value   RBC 6.44 (*)    MCV 70.8 (*)    MCH 23.0 (*)    All other components within normal limits  LIPASE, BLOOD  COMPREHENSIVE METABOLIC PANEL    EKG None  Radiology CT Head Wo Contrast  Result Date: 05/01/2022 CLINICAL DATA:  Increasing headaches EXAM: CT HEAD WITHOUT CONTRAST TECHNIQUE: Contiguous axial images were obtained from the base of the skull through the vertex without intravenous contrast. RADIATION DOSE REDUCTION: This exam was performed according to the departmental dose-optimization program which includes automated exposure control, adjustment of the mA and/or kV according to patient size and/or use of iterative reconstruction technique. COMPARISON:  08/01/2005 FINDINGS: Brain: No evidence of acute infarction, hemorrhage, hydrocephalus, extra-axial collection or mass  lesion/mass effect. Vascular: No hyperdense vessel or unexpected calcification. Skull: Normal. Negative for fracture or focal lesion. Sinuses/Orbits: No acute finding. Other: None. IMPRESSION: No acute intracranial abnormality noted. Electronically Signed   By: Inez Catalina M.D.   On: 05/01/2022 02:34    Procedures Procedures    Medications Ordered in ED Medications  sodium chloride 0.9 % bolus 500 mL (500 mLs Intravenous New Bag/Given 05/01/22 0139)  metoCLOPramide (REGLAN) injection 10 mg (10 mg Intravenous Given 05/01/22 0138)  meclizine (ANTIVERT) tablet 25 mg (25 mg Oral Given 05/01/22 0137)    ED Course/ Medical Decision Making/ A&P                             Medical Decision Making Amount and/or Complexity of Data Reviewed Radiology: ordered.  Risk Prescription drug management.   This patient presents to the ED for concern of headache, dizziness, this involves an extensive number of treatment options, and is a complaint that carries with it a high risk of complications and morbidity.  The differential diagnosis includes intracranial bleed, cranial mass, CVA, meningitis    Additional history obtained:  Additional history obtained from family bedside External records from outside source obtained and reviewed including recent ER notes   Co morbidities that complicate the patient evaluation  N/A  Social Determinants of Health:  None English speaking    Lab Tests:  I Ordered, and personally interpreted labs.  The pertinent results include: CBC unremarkable, CMP unremarkable, lipase 33   Imaging Studies ordered:  I ordered imaging studies including CT head I independently visualized and interpreted imaging which showed negative acute findings I agree with the radiologist interpretation   Cardiac Monitoring:  The patient was maintained on a cardiac monitor.  I personally viewed and interpreted the cardiac monitored which showed an underlying rhythm of: EKG  without signs of ischemia   Medicines ordered and prescription drug management:  I ordered medication including migraine cocktail I have reviewed the patients home medicines and have made adjustments as needed  Critical Interventions:  N/A   Reevaluation:  Presents with dizziness and headache, currently has some dizziness at this time, has a very mild headache rated at 1 out of 10, he had a benign physical exam, has had no recent CT head in the past, will obtain CT imaging for rule out possible mass provide with a migraine cocktail and reassess  Imaging was unremarkable, reassessed patient states he feels much better has no dizziness or headache at this time is agreement discharge.  Consultations Obtained:  N/a    Test Considered:  N/a  Rule out low suspicion for internal head bleed and or mass as CT imaging is negative for acute findings.  Low suspicion for CVA she has no focal deficit present my exam.  Low suspicion for dissection of the vertebral or carotid artery as presentation atypical of etiology.  Low suspicion for meningitis as she has no meningeal sign present.     Dispostion and problem list  After consideration of the diagnostic results and the patients response to treatment, I feel that the patent would benefit from discharge.  Headache dizziness-since resolved likely a complex headache with vertigo-like symptoms, will provide her with meclizine, have follow-up PCP and neurology for further evaluation and strict return precautions.            Final Clinical Impression(s) / ED Diagnoses Final diagnoses:  Vertigo  Acute nonintractable headache, unspecified headache type    Rx / DC Orders ED Discharge Orders          Ordered    meclizine (ANTIVERT) 25 MG tablet  3 times daily PRN        05/01/22 0314              Marcello Fennel, PA-C 05/01/22 0315    Maudie Flakes, MD 05/01/22 [phone]

## 2022-11-15 IMAGING — DX DG NECK SOFT TISSUE
2 series · 2 of 2 positions shown · non-contrast
Comparison: None.

CLINICAL DATA: Concern for fish bone stuck in throat.

EXAM:
NECK SOFT TISSUES - 1+ VIEW

[neck lat]
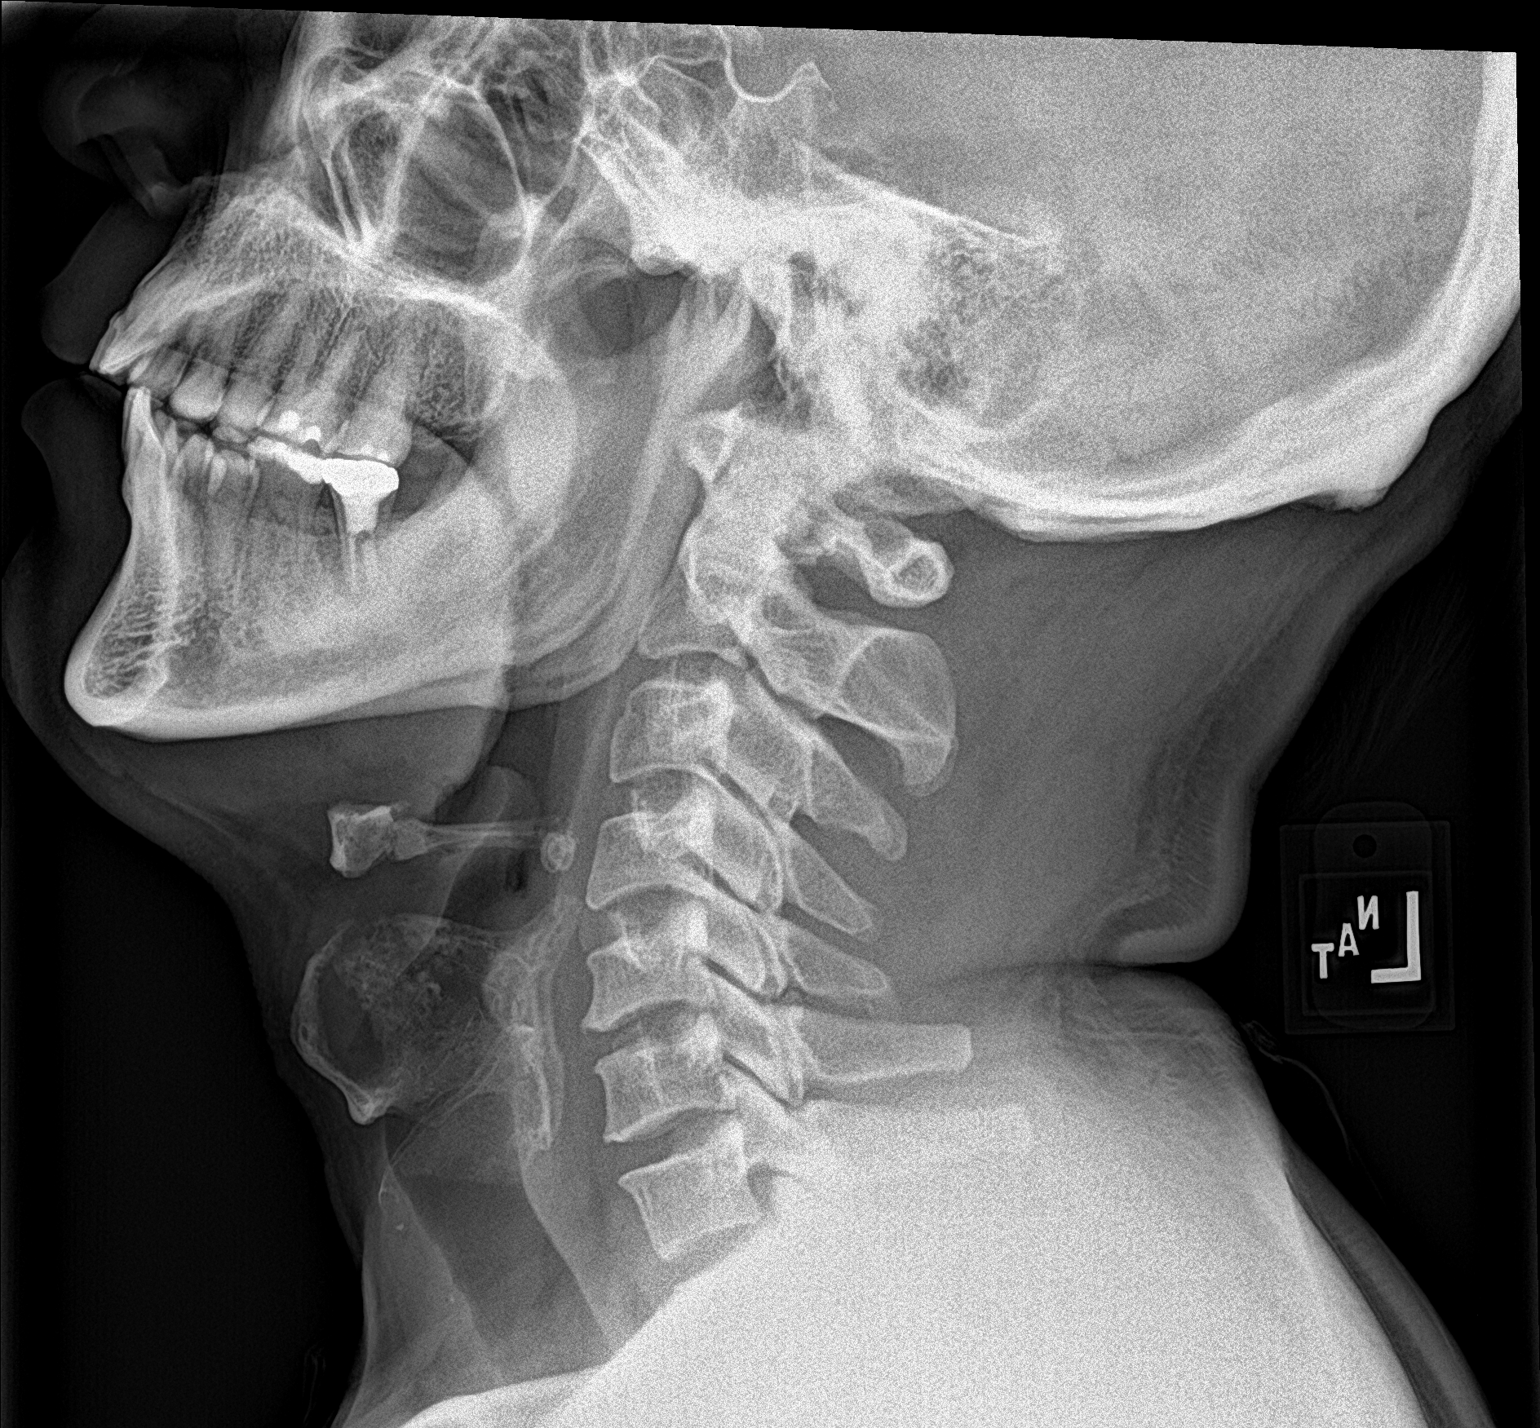

[neck ap]
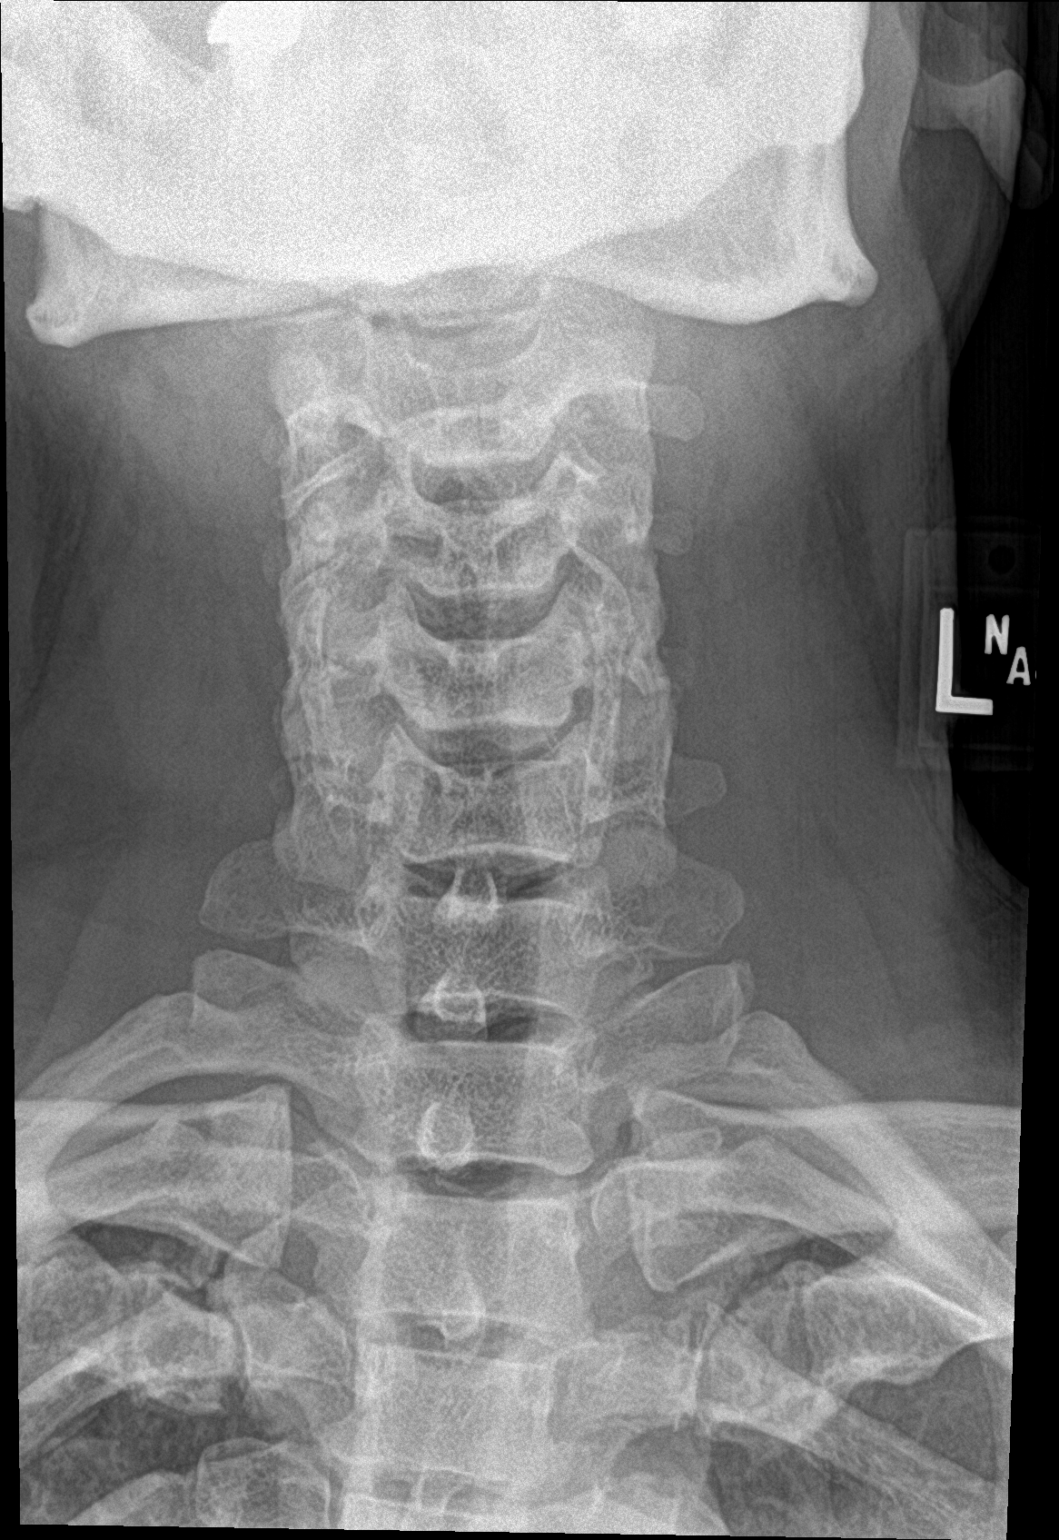

[2 of 2 positions shown; findings below may reference images not displayed]

FINDINGS: There is no evidence of retropharyngeal soft tissue swelling or
epiglottic enlargement. The cervical airway is unremarkable and no
radio-opaque foreign body identified.
IMPRESSION: Negative.

## 2022-11-28 ENCOUNTER — Encounter (HOSPITAL_COMMUNITY): Payer: Self-pay

## 2022-11-28 ENCOUNTER — Ambulatory Visit (HOSPITAL_COMMUNITY)
Admission: EM | Admit: 2022-11-28 | Discharge: 2022-11-28 | Disposition: A | Discharge status: Home / Self Care | Payer: PRIVATE HEALTH INSURANCE | Attending: Emergency Medicine | Admitting: Emergency Medicine

## 2022-11-28 DIAGNOSIS — R42 Dizziness and giddiness: Secondary | ICD-10-CM

## 2022-11-28 DIAGNOSIS — R519 Headache, unspecified: Secondary | ICD-10-CM

## 2022-11-28 MED ORDER — MECLIZINE HCL 25 MG PO TABS: 25.0000 mg | ORAL_TABLET | Freq: Three times a day (TID) | ORAL | 0 refills | Status: DC | PRN

## 2022-11-28 MED ORDER — KETOROLAC TROMETHAMINE 30 MG/ML IJ SOLN
INTRAMUSCULAR | Status: AC
  Filled 2022-11-28: qty 1

## 2022-11-28 MED ORDER — KETOROLAC TROMETHAMINE 60 MG/2ML IM SOLN
30.0000 mg | Freq: Once | INTRAMUSCULAR | Status: AC
  Administered 2022-11-28: 30 mg via INTRAMUSCULAR

## 2022-11-28 MED ORDER — IBUPROFEN 800 MG PO TABS: 800.0000 mg | ORAL_TABLET | Freq: Three times a day (TID) | ORAL | 0 refills | Status: DC

## 2022-11-28 NOTE — ED Triage Notes (Signed)
Pain in back of head, dizziness, and nausea onset 2 -3 days. No falls or head injuries. No history of head pain or dizziness. No vision changes. No known medical problems. Pain with movement of the head and relief with pressure.

## 2022-11-28 NOTE — ED Provider Notes (Signed)
[facility]    CSN: [id number] Arrival date & time: 11/28/22  1036      History   Chief Complaint Chief Complaint  Patient presents with   Headache   Dizziness    HPI Michael Frank is a 42 y.o. male.     Patient declined language interpretor. Family at bedside providing interpretation.   Headache and dizziness for the past three days. Nausea, no emesis. Posterior head pain, felt warm. Did not check temperature at home. Denies falls or head trauma. Denies photophobia or phono  Denies vision changes. Reports pain currently 4-5 on scale of 10, yesterday was worse.   Dizziness with head movement.   Was seen in the emergency department in March and diagnosed with a headache and vertigo-like symptoms, negative head CT, sent home on meclizine.  Advised to follow-up with neurology.  The history is provided by the patient and medical records.  Headache Associated symptoms: dizziness   Associated symptoms: no cough, no eye pain, no fatigue, no fever and no photophobia   Dizziness Associated symptoms: headaches   Associated symptoms: no chest pain     Past Medical History:  Diagnosis Date   Constipation     There are no problems to display for this patient.   History reviewed. No pertinent surgical history.     Home Medications    Prior to Admission medications   Medication Sig Start Date End Date Taking? Authorizing Provider  ibuprofen (ADVIL) 800 MG tablet Take 1 tablet (800 mg total) by mouth 3 (three) times daily. 11/28/22  Yes Rinaldo Ratel, [place] N, FNP  meclizine (ANTIVERT) 25 MG tablet Take 1 tablet (25 mg total) by mouth 3 (three) times daily as needed for dizziness. 11/28/22  Yes Alexzandrea Normington, [place] N, FNP    Family History History reviewed. No pertinent family history.  Social History Social History   Tobacco Use   Smoking status: Never   Smokeless tobacco: Never  Vaping Use   Vaping status: Never Used  Substance Use Topics   Alcohol use: Yes     Comment: occasionally   Drug use: No     Allergies   Patient has no known allergies.   Review of Systems Review of Systems  Constitutional:  Negative for fatigue and fever.  Eyes:  Negative for photophobia, pain and visual disturbance.  Respiratory:  Negative for cough.   Cardiovascular:  Negative for chest pain.  Neurological:  Positive for dizziness and headaches.     Physical Exam Triage Vital Signs ED Triage Vitals  Encounter Vitals Group     BP 11/28/22 1059 123/83     Systolic BP Percentile --      Diastolic BP Percentile --      Pulse Rate 11/28/22 1059 (!) 59     Resp 11/28/22 1059 18     Temp 11/28/22 1059 98.4 F (36.9 C)     Temp Source 11/28/22 1059 Oral     SpO2 11/28/22 1059 97 %     Weight 11/28/22 1059 169 lb 15.6 oz (77.1 kg)     Height 11/28/22 1059 5\' 4"  (1.626 m)     Head Circumference --      Peak Flow --      Pain Score 11/28/22 1058 8     Pain Loc --      Pain Education --      Exclude from Growth Chart --    No data found.  Updated Vital Signs BP 123/83 (BP Location: Left  Arm)   Pulse (!) 59   Temp 98.4 F (36.9 C) (Oral)   Resp 18   Ht 5\' 4"  (1.626 m)   Wt 169 lb 15.6 oz (77.1 kg)   SpO2 97%   BMI 29.18 kg/m   Visual Acuity Right Eye Distance:   Left Eye Distance:   Bilateral Distance:    Right Eye Near:   Left Eye Near:    Bilateral Near:     Physical Exam Vitals and nursing note reviewed.  Constitutional:      Appearance: Normal appearance. He is well-developed.  HENT:     Head: Normocephalic and atraumatic.     Right Ear: External ear normal.     Left Ear: External ear normal.     Nose: Nose normal.     Mouth/Throat:     Mouth: Mucous membranes are moist.  Eyes:     Extraocular Movements: Extraocular movements intact.     Conjunctiva/sclera: Conjunctivae normal.     Pupils: Pupils are equal, round, and reactive to light.  Cardiovascular:     Rate and Rhythm: Normal rate and regular rhythm.     Heart sounds:  Normal heart sounds. No murmur heard. Pulmonary:     Effort: Pulmonary effort is normal. No respiratory distress.     Breath sounds: Normal breath sounds.  Musculoskeletal:        General: Normal range of motion.     Cervical back: Normal range of motion.  Skin:    General: Skin is warm and dry.  Neurological:     General: No focal deficit present.     Mental Status: He is alert and oriented to person, place, and time.     GCS: GCS eye subscore is 4. GCS verbal subscore is 5. GCS motor subscore is 6.     Cranial Nerves: No cranial nerve deficit.     Sensory: No sensory deficit.  Psychiatric:        Mood and Affect: Mood normal.        Behavior: Behavior normal.      UC Treatments / Results  Labs (all labs ordered are listed, but only abnormal results are displayed) Labs Reviewed - No data to display  EKG   Radiology No results found.  Procedures Procedures (including critical care time)  Medications Ordered in UC Medications  ketorolac (TORADOL) injection 30 mg (has no administration in time range)    Initial Impression / Assessment and Plan / UC Course  I have reviewed the triage vital signs and the nursing notes.  Pertinent labs & imaging results that were available during my care of the patient were reviewed by me and considered in my medical decision making (see chart for details).  Vitals and triage reviewed, patient is hemodynamically stable.  Pupils are equal round reactive to light and accommodation.  Cranial nerves II through XII grossly intact.  Strength 5 out of 5 in upper and lower extremities.  Headache is moderate in clinic.  No vision changes.  Low concern for acute intracranial pathology or need for emergent imaging, negative CT head in March with similar presentation.  Given IM Toradol in clinic for headache and meclizine for dizziness.  Plan of care, follow-up care and return precautions discussed, no questions at this time.     Final Clinical  Impressions(s) / UC Diagnoses   Final diagnoses:  Vertigo  Bad headache     Discharge Instructions      We have given him a dose  of intramuscular Toradol to help with his headache.  You can try gentle stretching and warm compress to the area of pain as well.  Start ibuprofen tomorrow if headache persists. For the dizziness take the meclizine up to 3 times daily.  Ensure he is staying well-hydrated with only 64 ounces of water daily and changing positions slowly.  Seek immediate care at the nearest emergency department if he develops vomiting, fever, the worst headache of his life, or any vision changes.  Return to clinic for new or urgent symptoms.      ED Prescriptions     Medication Sig Dispense Auth. Provider   meclizine (ANTIVERT) 25 MG tablet Take 1 tablet (25 mg total) by mouth 3 (three) times daily as needed for dizziness. 30 tablet Rinaldo Ratel, [place] N, [place]   ibuprofen (ADVIL) 800 MG tablet Take 1 tablet (800 mg total) by mouth 3 (three) times daily. 21 tablet Araceli Arango, [place] N, [place]      PDMP not reviewed this encounter.   Gerrick Ray, [place] N, [place] 11/28/22 1139

## 2022-11-28 NOTE — Discharge Instructions (Addendum)
We have given him a dose of intramuscular Toradol to help with his headache.  You can try gentle stretching and warm compress to the area of pain as well.  Start ibuprofen tomorrow if headache persists. For the dizziness take the meclizine up to 3 times daily.  Ensure he is staying well-hydrated with only 64 ounces of water daily and changing positions slowly.  Seek immediate care at the nearest emergency department if he develops vomiting, fever, the worst headache of his life, or any vision changes.  Return to clinic for new or urgent symptoms.

## 2022-12-08 ENCOUNTER — Encounter (HOSPITAL_COMMUNITY): Payer: Self-pay | Admitting: Emergency Medicine

## 2022-12-08 ENCOUNTER — Ambulatory Visit (HOSPITAL_COMMUNITY)
Admission: EM | Admit: 2022-12-08 | Discharge: 2022-12-08 | Disposition: A | Discharge status: Home / Self Care | Payer: Self-pay | Attending: Family Medicine | Admitting: Family Medicine

## 2022-12-08 DIAGNOSIS — M79604 Pain in right leg: Secondary | ICD-10-CM

## 2022-12-08 MED ORDER — MELOXICAM 7.5 MG PO TABS: 15.0000 mg | ORAL_TABLET | Freq: Every day | ORAL | 0 refills | Status: AC

## 2022-12-08 NOTE — ED Provider Notes (Signed)
[facility]    CSN: [id number] Arrival date & time: 12/08/22  7829      History   Chief Complaint Chief Complaint  Patient presents with   Leg Pain    HPI Michael Frank is a 42 y.o. male.   Patient is presenting with right posterior calf pain more so located on the lateral side.  Patient states that the pain occurs whenever he is walking for long peers time or working.  Patient states that there is no pain at rest.  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  Patient states that he is hoping to get something for pain relief.  Patient is a non-smoker.  Patient was seen for something similar a few months ago and states that the pain is essentially the same since then.  Patient is accompanied by his son who is translating for him.  No other concerns at this time   Leg Pain   Past Medical History:  Diagnosis Date   Constipation     There are no problems to display for this patient.   History reviewed. No pertinent surgical history.     Home Medications    Prior to Admission medications   Medication Sig Start Date End Date Taking? Authorizing Provider  meloxicam (MOBIC) 7.5 MG tablet Take 2 tablets (15 mg total) by mouth daily. 12/08/22 01/07/23 Yes Brenton Grills, MD  meclizine (ANTIVERT) 25 MG tablet Take 1 tablet (25 mg total) by mouth 3 (three) times daily as needed for dizziness. 11/28/22   Garrison, [place] N, FNP    Family History History reviewed. No pertinent family history.  Social History Social History   Tobacco Use   Smoking status: Never   Smokeless tobacco: Never  Vaping Use   Vaping status: Never Used  Substance Use Topics   Alcohol use: Yes    Comment: occasionally   Drug use: No     Allergies   Patient has no known allergies.   Review of Systems Review of Systems   Physical Exam Triage Vital Signs ED Triage Vitals  Encounter Vitals Group     BP 12/08/22 1033 (!) 137/90     Systolic BP  Percentile --      Diastolic BP Percentile --      Pulse Rate 12/08/22 1033 (!) 55     Resp 12/08/22 1033 16     Temp 12/08/22 1033 97.8 F (36.6 C)     Temp Source 12/08/22 1033 Oral     SpO2 12/08/22 1033 95 %     Weight --      Height --      Head Circumference --      Peak Flow --      Pain Score 12/08/22 1032 8     Pain Loc --      Pain Education --      Exclude from Growth Chart --    No data found.  Updated Vital Signs BP (!) 137/90 (BP Location: Left Arm)   Pulse (!) 55   Temp 97.8 F (36.6 C) (Oral)   Resp 16   SpO2 95%   Visual Acuity Right Eye Distance:   Left Eye Distance:   Bilateral Distance:    Right Eye Near:   Left Eye Near:    Bilateral Near:     Physical Exam  Right FAO:ZHYQM no signs of erythema, swelling, bruising or any bony abnormality.  Range of motion  is full and there is no pain with Denna Haggard' sign of the calf.  There is no tenderness to palpation over the lateral or medial gastroc muscle.  No tenderness over the popliteus tendon.  Strength is 5 out of 5.  UC Treatments / Results  Labs (all labs ordered are listed, but only abnormal results are displayed) Labs Reviewed - No data to display  EKG   Radiology No results found.  Procedures Procedures (including critical care time)  Medications Ordered in UC Medications - No data to display  Initial Impression / Assessment and Plan / UC Course  I have reviewed the triage vital signs and the nursing notes.  Pertinent labs & imaging results that were available during my care of the patient were reviewed by me and considered in my medical decision making (see chart for details).     Patient's right calf pain is nonspecific although can consider some version of peripheral arterial disease given patient's history which states that it only occurs whenever he walks for long periods of time or whenever he is working.  Patient states he does not smoke and has never smoked so he is a low risk  for PAD though should still be evaluated by PCP.  Patient symptoms does not appear to be located to any of the gastroc muscles, there is no concern for any structural deficit.  At this time, will do meloxicam for pain and have patient follow-up with the PCP for possible imaging.  Patient is agreeable and understanding. Final Clinical Impressions(s) / UC Diagnoses   Final diagnoses:  Right leg pain     Discharge Instructions      I have listed the  family medicine center which is right next door.  Please give them a call and asked to make an appointment to establish care and you may discuss your leg pain at that time as well.  Please take your meloxicam 2 pills a day for your pain.  Please avoid ibuprofen.   ED Prescriptions     Medication Sig Dispense Auth. Provider   meloxicam (MOBIC) 7.5 MG tablet Take 2 tablets (15 mg total) by mouth daily. 60 tablet Brenton Grills, MD      PDMP not reviewed this encounter.   Brenton Grills, MD 12/08/22 [phone]

## 2022-12-08 NOTE — Discharge Instructions (Addendum)
I have listed the [place] [facility] which is right next door.  Please give them a call and asked to make an appointment to establish care and you may discuss your leg pain at that time as well.  Please take your meloxicam 2 pills a day for your pain.  Please avoid ibuprofen.

## 2022-12-08 NOTE — ED Triage Notes (Signed)
Pt c/o right leg pain in calf that is worse when he walks. He came in last year for same thing and had an x-ray noting was found and was told it was probably his shoes.

## 2023-05-02 DIAGNOSIS — S22080A Wedge compression fracture of T11-T12 vertebra, initial encounter for closed fracture: Secondary | ICD-10-CM | POA: Insufficient documentation

## 2023-05-02 DIAGNOSIS — S065XAA Traumatic subdural hemorrhage with loss of consciousness status unknown, initial encounter: Secondary | ICD-10-CM | POA: Insufficient documentation

## 2023-05-02 DIAGNOSIS — S060X9A Concussion with loss of consciousness of unspecified duration, initial encounter: Secondary | ICD-10-CM | POA: Insufficient documentation

## 2023-05-02 DIAGNOSIS — S32010A Wedge compression fracture of first lumbar vertebra, initial encounter for closed fracture: Secondary | ICD-10-CM | POA: Insufficient documentation

## 2023-05-05 DIAGNOSIS — W132XXA Fall from, out of or through roof, initial encounter: Secondary | ICD-10-CM | POA: Insufficient documentation

## 2023-05-06 DIAGNOSIS — I10 Essential (primary) hypertension: Secondary | ICD-10-CM | POA: Insufficient documentation

## 2023-07-06 ENCOUNTER — Ambulatory Visit (HOSPITAL_COMMUNITY)
Admission: EM | Admit: 2023-07-06 | Discharge: 2023-07-06 | Disposition: A | Discharge status: Home / Self Care | Payer: Self-pay | Attending: Physician Assistant | Admitting: Physician Assistant

## 2023-07-06 ENCOUNTER — Encounter (HOSPITAL_COMMUNITY): Payer: Self-pay | Admitting: Emergency Medicine

## 2023-07-06 ENCOUNTER — Ambulatory Visit (INDEPENDENT_AMBULATORY_CARE_PROVIDER_SITE_OTHER): Payer: Self-pay

## 2023-07-06 DIAGNOSIS — M898X1 Other specified disorders of bone, shoulder: Secondary | ICD-10-CM

## 2023-07-06 DIAGNOSIS — W19XXXD Unspecified fall, subsequent encounter: Secondary | ICD-10-CM

## 2023-07-06 DIAGNOSIS — M25511 Pain in right shoulder: Secondary | ICD-10-CM

## 2023-07-06 MED ORDER — NAPROXEN 375 MG PO TABS: 375.0000 mg | ORAL_TABLET | Freq: Two times a day (BID) | ORAL | 0 refills | Status: DC

## 2023-07-06 NOTE — Discharge Instructions (Signed)
 I did not see anything on your x-ray such as a broken bone.  It is possible that your collarbone and shoulder are little bit farther apart than his normal but I am waiting for the radiologist to read over me to determine if this is the case.  Regardless, I think you should follow-up with a orthopedic specialist.  Call them to schedule an appointment.  Take Naprosyn for pain relief.  Do not take NSAIDs with this medication including aspirin, ibuprofen /Advil , naproxen/Aleve.  You can use Tylenol/acetaminophen for additional pain relief.  If anything worsens and you have increasing pain, numbness or tingling in your hand, trouble moving your shoulder you need to be seen immediately.

## 2023-07-06 NOTE — ED Provider Notes (Signed)
 [facility]    CSN: [id number] Arrival date & time: 07/06/23  0946      History   Chief Complaint Chief Complaint  Patient presents with   Shoulder Pain    HPI Michael Frank is a 43 y.o. male.   Patient presents today with a several month history of right clavicular and shoulder pain.  Patient speaks [place] and his son provided interpretation.  He reports that on 05/02/2023 he fell approximately 11 feet from a roof onto a concrete slab.  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  He reports that the symptoms have improved but he continues to have right shoulder discomfort with swelling/deformity noted at sternoclavicular joint.  He is eating and drinking normally denies any associated dysphagia or shortness of breath.  He denies any numbness or paresthesias in his fingers.  Currently pain is rated 3 on a 0-10 pain scale but increases with overhead movement or abduction.  Denies previous injury or surgery involving his shoulder.  He is right-handed.  He has been taking over-the-counter analgesics without improvement of symptoms.    Past Medical History:  Diagnosis Date   Constipation     There are no active problems to display for this patient.   History reviewed. No pertinent surgical history.     Home Medications    Prior to Admission medications   Medication Sig Start Date End Date Taking? Authorizing Provider  naproxen (NAPROSYN) 375 MG tablet Take 1 tablet (375 mg total) by mouth 2 (two) times daily. 07/06/23  Yes Rosali Augello, Betsey Brow, PA-C    Family History No family history on file.  Social History Social History   Tobacco Use   Smoking status: Never   Smokeless tobacco: Never  Vaping Use   Vaping status: Never Used  Substance Use Topics   Alcohol use: Yes    Comment: occasionally   Drug use: No     Allergies   Patient has no known allergies.   Review of Systems Review  of Systems  Constitutional:  Positive for activity change. Negative for appetite change, fatigue and fever.  Musculoskeletal:  Positive for arthralgias. Negative for joint swelling and myalgias.  Skin:  Negative for color change and wound.  Neurological:  Negative for weakness and numbness.     Physical Exam Triage Vital Signs ED Triage Vitals  Encounter Vitals Group     BP 07/06/23 1046 126/85     Systolic BP Percentile --      Diastolic BP Percentile --      Pulse Rate 07/06/23 1046 61     Resp 07/06/23 1046 15     Temp 07/06/23 1046 97.8 F (36.6 C)     Temp Source 07/06/23 1046 Oral     SpO2 07/06/23 1046 97 %     Weight --      Height --      Head Circumference --      Peak Flow --      Pain Score 07/06/23 1045 0     Pain Loc --      Pain Education --      Exclude from Growth Chart --    No data found.  Updated Vital Signs BP 126/85 (BP Location: Left Arm)   Pulse 61   Temp 97.8 F (36.6 C) (Oral)   Resp 15   SpO2 97%   Visual Acuity Right Eye Distance:  Left Eye Distance:   Bilateral Distance:    Right Eye Near:   Left Eye Near:    Bilateral Near:     Physical Exam Vitals reviewed.  Constitutional:      General: He is awake.     Appearance: Normal appearance. He is well-developed. He is not ill-appearing.     Comments: Very pleasant male presented age in no acute distress sitting comfortably in exam room  HENT:     Head: Normocephalic and atraumatic.     Mouth/Throat:     Pharynx: No oropharyngeal exudate, posterior oropharyngeal erythema or uvula swelling.  Cardiovascular:     Rate and Rhythm: Normal rate and regular rhythm.     Heart sounds: Normal heart sounds, S1 normal and S2 normal. No murmur heard.    Comments: Capillary fill within 2 seconds right hand fingers Pulmonary:     Effort: Pulmonary effort is normal.     Breath sounds: Normal breath sounds. No stridor. No wheezing, rhonchi or rales.     Comments: Clear to auscultation  bilaterally Musculoskeletal:     Right shoulder: Tenderness present. No swelling or bony tenderness. Decreased range of motion. Normal strength.     Right hand: No swelling. Normal sensation. There is no disruption of two-point discrimination. Normal capillary refill.     Comments: Right shoulder: Tender to palpation over [facility] joint and scapular spine as well as at sternoclavicular joint.  Deformity noted at right sternoclavicular joint with associated tenderness palpation.  Right hand is neurovascularly intact.  Decreased range of motion with overhead flexion and abduction of the shoulder.  Strength 5/5 bilateral upper extremities.  Patient is wearing a back brace.  Neurological:     Mental Status: He is alert.  Psychiatric:        Behavior: Behavior is cooperative.      UC Treatments / Results  Labs (all labs ordered are listed, but only abnormal results are displayed) Labs Reviewed - No data to display  EKG   Radiology No results found.  Procedures Procedures (including critical care time)  Medications Ordered in UC Medications - No data to display  Initial Impression / Assessment and Plan / UC Course  I have reviewed the triage vital signs and the nursing notes.  Pertinent labs & imaging results that were available during my care of the patient were reviewed by me and considered in my medical decision making (see chart for details).     Patient is well-appearing, afebrile, nontoxic, nontachycardic.  Hand is neurovascularly intact.  X-rays of shoulder and clavicle were obtained that showed no acute osseous abnormality based on my primary read but possible low-grade AC separation based on single view of shoulder x-rays.  We discussed that I do not believe that this is causing his pain particularly given he is tender over his medial clavicle, however, we will contact him if the radiologist sees anything that changes our treatment plan.  He was encouraged to avoid strenuous  activity including heavy lifting.  He was started on Naprosyn for pain relief.  We discussed that he should not take additional NSAIDs with this medication but can use acetaminophen/Tylenol for breakthrough pain.  Given his persistent symptoms I did recommend that he follow-up with orthopedics and was given the contact information for provider on-call with instruction to call to schedule an appointment with them.  We discussed that if anything worsens and he has increasing pain, deformity, numbness or paresthesias in the hand, weakness he needs to be  seen emergently.  Strict return precautions given.  Excuse note provided.   Final Clinical Impressions(s) / UC Diagnoses   Final diagnoses:  Fall, subsequent encounter  Acute pain of right shoulder  Pain of right clavicle     Discharge Instructions      I did not see anything on your x-ray such as a broken bone.  It is possible that your collarbone and shoulder are little bit farther apart than his normal but I am waiting for the radiologist to read over me to determine if this is the case.  Regardless, I think you should follow-up with a orthopedic specialist.  Call them to schedule an appointment.  Take Naprosyn for pain relief.  Do not take NSAIDs with this medication including aspirin, ibuprofen /Advil , naproxen/Aleve.  You can use Tylenol/acetaminophen for additional pain relief.  If anything worsens and you have increasing pain, numbness or tingling in your hand, trouble moving your shoulder you need to be seen immediately.   ED Prescriptions     Medication Sig Dispense Auth. Provider   naproxen (NAPROSYN) 375 MG tablet Take 1 tablet (375 mg total) by mouth 2 (two) times daily. 20 tablet Shonique Pelphrey K, PA-C      PDMP not reviewed this encounter.   Budd Cargo, PA-C 07/06/23 1146

## 2023-07-06 NOTE — ED Triage Notes (Signed)
 Pt feel off roof month ago and was in hospital for several days. Reports that bone in shoulder area on right side misplaced. Has pain when coughs, sneezes or sits up straight. Wearing a brace. Hasn't taken any measures for pain.

## 2023-07-07 DIAGNOSIS — M25511 Pain in right shoulder: Secondary | ICD-10-CM | POA: Diagnosis not present

## 2023-09-25 NOTE — ED Notes (Signed)
 Patient fell off the roof in March and would like a CT scan.Patient states he is having headache. Per Dr.Banister patient should be seen at the ED for further work-up. Patient voice understanding.

## 2023-10-14 ENCOUNTER — Ambulatory Visit: Payer: Self-pay | Admitting: Nurse Practitioner

## 2023-10-24 ENCOUNTER — Encounter (HOSPITAL_COMMUNITY): Payer: Self-pay | Admitting: Emergency Medicine

## 2023-10-24 ENCOUNTER — Ambulatory Visit (HOSPITAL_COMMUNITY)
Admission: EM | Admit: 2023-10-24 | Discharge: 2023-10-24 | Disposition: A | Discharge status: Home / Self Care | Attending: Family Medicine | Admitting: Family Medicine

## 2023-10-24 DIAGNOSIS — M542 Cervicalgia: Secondary | ICD-10-CM

## 2023-10-24 MED ORDER — TIZANIDINE HCL 4 MG PO TABS: 2.0000 mg | ORAL_TABLET | Freq: Every evening | ORAL | 0 refills | Status: DC | PRN

## 2023-10-24 MED ORDER — IBUPROFEN 800 MG PO TABS: 800.0000 mg | ORAL_TABLET | Freq: Three times a day (TID) | ORAL | 0 refills | Status: DC | PRN

## 2023-10-24 NOTE — ED Triage Notes (Signed)
 Pt was supposed to see neuro specialist in [place] but got tired of the far commute and was supposed to be referred to one closer to [place] but never heard anything about an appointment.

## 2023-10-24 NOTE — Discharge Instructions (Signed)
 Take ibuprofen  800 mg--1 tab every 8 hours as needed for pain.   Take tizanidine  4 mg--1 at bedtime as needed for muscle spasms; this medication can cause dizziness and sleepiness

## 2023-10-24 NOTE — ED Provider Notes (Signed)
 [facility]    CSN: [id number] Arrival date & time: 10/24/23  1006      History   Chief Complaint Chief Complaint  Patient presents with   Neck Pain    HPI Michael Frank is a 43 y.o. male.    Neck Pain Here for right sided neck pain  He also mentions some tinnitus in his left ear.  His family member is translating for him in his native language of the [place]  About 5 months ago he fell from a roof and was knocked unconscious.  Apparently he was in the hospital for about a week.  I cannot see any of the records from that admission.  He states that he had a lumbar spine fracture but it did not require any surgery at the time.  He is not having any low back pain now and he is not having any numbness or tingling or lower extremity weakness.  His main problem is some right sided neck pain that hurts when he turns his head.  No bowel or bladder incontinence and no saddle anesthesia.  He does have some tinnitus ever since he had that head injury.  No ear pain  NKDA  Past Medical History:  Diagnosis Date   Constipation     There are no active problems to display for this patient.   History reviewed. No pertinent surgical history.     Home Medications    Prior to Admission medications   Medication Sig Start Date End Date Taking? Authorizing Provider  ibuprofen  (ADVIL ) 800 MG tablet Take 1 tablet (800 mg total) by mouth every 8 (eight) hours as needed (pain). 10/24/23  Yes Vonna Sharlet POUR, MD  tiZANidine  (ZANAFLEX ) 4 MG tablet Take 0.5-1 tablets (2-4 mg total) by mouth at bedtime as needed for muscle spasms. 10/24/23  Yes Vonna Sharlet POUR, MD    Family History No family history on file.  Social History Social History   Tobacco Use   Smoking status: Never   Smokeless tobacco: Never  Vaping Use   Vaping status: Never Used  Substance Use Topics   Alcohol use: Yes    Comment: occasionally   Drug use: No     Allergies   Patient has no  known allergies.   Review of Systems Review of Systems  Musculoskeletal:  Positive for neck pain.     Physical Exam Triage Vital Signs ED Triage Vitals  Encounter Vitals Group     BP 10/24/23 1041 (!) 136/91     Girls Systolic BP Percentile --      Girls Diastolic BP Percentile --      Boys Systolic BP Percentile --      Boys Diastolic BP Percentile --      Pulse Rate 10/24/23 1041 63     Resp 10/24/23 1041 17     Temp 10/24/23 1041 98.1 F (36.7 C)     Temp Source 10/24/23 1041 Oral     SpO2 10/24/23 1041 98 %     Weight --      Height --      Head Circumference --      Peak Flow --      Pain Score 10/24/23 1034 5     Pain Loc --      Pain Education --      Exclude from Growth Chart --    No data found.  Updated Vital Signs BP (!) 136/91 (BP Location: Left Arm)   Pulse  63   Temp 98.1 F (36.7 C) (Oral)   Resp 17   SpO2 98%   Visual Acuity Right Eye Distance:   Left Eye Distance:   Bilateral Distance:    Right Eye Near:   Left Eye Near:    Bilateral Near:     Physical Exam Vitals reviewed.  Constitutional:      General: He is not in acute distress.    Appearance: He is not toxic-appearing.  HENT:     Right Ear: Tympanic membrane and ear canal normal.     Left Ear: Ear canal normal.     Ears:     Comments: Tympanic membrane appears intact but there is some scarring on the anterior portion.    Nose: Nose normal.     Mouth/Throat:     Mouth: Mucous membranes are moist.  Eyes:     Extraocular Movements: Extraocular movements intact.     Conjunctiva/sclera: Conjunctivae normal.     Pupils: Pupils are equal, round, and reactive to light.  Neck:     Comments: There is a little tenderness on the right trapezius and the neck Cardiovascular:     Rate and Rhythm: Normal rate and regular rhythm.     Heart sounds: No murmur heard. Pulmonary:     Effort: Pulmonary effort is normal.     Breath sounds: Normal breath sounds.  Musculoskeletal:     Cervical  back: Neck supple.  Lymphadenopathy:     Cervical: No cervical adenopathy.  Skin:    Capillary Refill: Capillary refill takes less than 2 seconds.     Coloration: Skin is not jaundiced or pale.  Neurological:     General: No focal deficit present.     Mental Status: He is alert and oriented to person, place, and time.  Psychiatric:        Behavior: Behavior normal.      UC Treatments / Results  Labs (all labs ordered are listed, but only abnormal results are displayed) Labs Reviewed - No data to display  EKG   Radiology No results found.  Procedures Procedures (including critical care time)  Medications Ordered in UC Medications - No data to display  Initial Impression / Assessment and Plan / UC Course  I have reviewed the triage vital signs and the nursing notes.  Pertinent labs & imaging results that were available during my care of the patient were reviewed by me and considered in my medical decision making (see chart for details).     Ibuprofen  is sent in for inflammation and tizanidine  is sent in for nighttime use.  Staff will help him set up a primary care appointment  He is given contact information for orthopedics. Final Clinical Impressions(s) / UC Diagnoses   Final diagnoses:  Neck pain     Discharge Instructions      Take ibuprofen  800 mg--1 tab every 8 hours as needed for pain.   Take tizanidine  4 mg--1 at bedtime as needed for muscle spasms; this medication can cause dizziness and sleepiness        ED Prescriptions     Medication Sig Dispense Auth. Provider   ibuprofen  (ADVIL ) 800 MG tablet Take 1 tablet (800 mg total) by mouth every 8 (eight) hours as needed (pain). 21 tablet Rhianon Zabawa K, MD   tiZANidine  (ZANAFLEX ) 4 MG tablet Take 0.5-1 tablets (2-4 mg total) by mouth at bedtime as needed for muscle spasms. 10 tablet Vonna Sharlet POUR, MD  I have reviewed the PDMP during this encounter.   Vonna Sharlet POUR,  MD 10/24/23 [phone]

## 2023-10-24 NOTE — ED Triage Notes (Signed)
 Pt family member is translating for patient  Pt fell off a roof 5 months ago and reports still having pain on posterior and right side of neck and head. Pt was seen at hospital after the fall and had a fracture in lower back. Pt had been sent to different places due to pain.  Reports has ringing in left ear since accident.  He out of medication that was prescribed when was seen at hospital 5 months ago. Has tried OTC medications but reports they dont help with the pain.

## 2024-01-29 ENCOUNTER — Ambulatory Visit: Payer: Self-pay | Admitting: Family Medicine

## 2024-01-29 ENCOUNTER — Encounter: Payer: Self-pay | Admitting: Family Medicine

## 2024-01-29 VITALS — BP 120/78 | HR 71 | Temp 98.1°F | Ht 64.0 in | Wt 172.0 lb

## 2024-01-29 DIAGNOSIS — H9312 Tinnitus, left ear: Secondary | ICD-10-CM

## 2024-01-29 DIAGNOSIS — Z1159 Encounter for screening for other viral diseases: Secondary | ICD-10-CM | POA: Diagnosis not present

## 2024-01-29 DIAGNOSIS — M545 Low back pain, unspecified: Secondary | ICD-10-CM

## 2024-01-29 DIAGNOSIS — M542 Cervicalgia: Secondary | ICD-10-CM | POA: Diagnosis not present

## 2024-01-29 DIAGNOSIS — Z7689 Persons encountering health services in other specified circumstances: Secondary | ICD-10-CM | POA: Diagnosis not present

## 2024-01-29 MED ORDER — METHOCARBAMOL 500 MG PO TABS: 500.0000 mg | ORAL_TABLET | Freq: Every day | ORAL | 1 refills | Status: AC | PRN

## 2024-01-29 MED ORDER — DICLOFENAC SODIUM 75 MG PO TBEC: 75.0000 mg | DELAYED_RELEASE_TABLET | Freq: Every day | ORAL | 1 refills | Status: AC | PRN

## 2024-01-29 NOTE — Progress Notes (Signed)
 I,Jameka J Llittleton, CMA,acting as a neurosurgeon for [employer], NP.,have documented all relevant documentation on the behalf of Bruna Creighton, NP,as directed by  Bruna Creighton, NP while in the presence of Bruna Creighton, NP.  Subjective:  Patient ID: Michael Frank , male    DOB: 10-06-1980 , 43 y.o.   MRN: [id number]  Chief Complaint  Patient presents with   Establish Care   Referral    Patient reports he had a fall back in March. He reports he was seeing a radio producer but that provider is an hour away. He reports he is back having neck and back pain.    decreased hearing    HPI Discussed the use of AI scribe software for clinical note transcription with the patient, who gave verbal consent to proceed.  History of Present Illness    Michael Frank is a 43 year old male who presents with back and neck pain following a fall from a roof.  He has experienced persistent pain in his neck, head, and back since falling from a roof on May 02, 2023. The neck and head pain are sometimes accompanied by dizziness, especially when standing up or walking. He also experiences ringing in his left ear since the fall, which he describes as 'deafening'.  He was initially taken to a hospital by ambulance and was unconscious at the time of the fall. He stayed in the hospital for about a week and was seen by a neurosurgeon. He has not had any surgeries since the incident.  He experiences muscle cramps, particularly in his fingers. He has been taking Tylenol intermittently for pain relief, usually one to two tablets every two to three days. He rates his pain as a four to five out of ten when resting, but it can increase to an eight when active.  He has not returned to work full-time but has been working intermittently, taking breaks as needed. 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  No high blood pressure, high cholesterol, or other chronic conditions. No surgeries since the  fall.      Past Medical History:  Diagnosis Date   Constipation      History reviewed. No pertinent family history.   Current Outpatient Medications:    diclofenac  (VOLTAREN ) 75 MG EC tablet, Take 1 tablet (75 mg total) by mouth daily as needed., Disp: 30 tablet, Rfl: 1   methocarbamol  (ROBAXIN ) 500 MG tablet, Take 1 tablet (500 mg total) by mouth daily as needed for muscle spasms., Disp: 30 tablet, Rfl: 1   No Known Allergies   Review of Systems  Constitutional: Negative.   Respiratory: Negative.    Cardiovascular: Negative.   Musculoskeletal:  Positive for arthralgias, back pain, myalgias and neck pain.  Neurological: Negative.   Psychiatric/Behavioral: Negative.       Today's Vitals   01/29/24 1053  BP: 120/78  Pulse: 71  Temp: 98.1 F (36.7 C)  TempSrc: Oral  Weight: 172 lb (78 kg)  Height: 5' 4 (1.626 m)  PainSc: 0-No pain   Body mass index is 29.52 kg/m.  Wt Readings from Last 3 Encounters:  01/29/24 172 lb (78 kg)  11/28/22 169 lb 15.6 oz (77.1 kg)  04/30/22 169 lb 15.6 oz (77.1 kg)    The ASCVD Risk score (Arnett DK, et al., 2019) failed to calculate for the following reasons:   Cannot find a previous HDL lab   Cannot find a previous total  cholesterol lab   * - Cholesterol units were assumed  Objective:  Physical Exam Constitutional:      Appearance: Normal appearance.  Cardiovascular:     Rate and Rhythm: Normal rate and regular rhythm.     Pulses: Normal pulses.     Heart sounds: Normal heart sounds.  Pulmonary:     Effort: Pulmonary effort is normal.     Breath sounds: No rales.  Abdominal:     General: Bowel sounds are normal.  Musculoskeletal:        General: Tenderness present.     Cervical back: Tenderness present.  Skin:    General: Skin is warm and dry.  Neurological:     Mental Status: He is alert and oriented to person, place, and time. Mental status is at baseline.         Assessment And Plan:   Assessment &  Plan Encounter to establish care with new doctor  Tinnitus of left ear Persistent tinnitus in left ear post-fall, described as ringing. - Referred to ENT specialist for evaluation. Neck pain, musculoskeletal Chronic neck pain post-fall, - Referred to neurosurgeon in [place]. - Instructed to sign medical release form for records from [facility] in [place].  Lumbar back pain Chronic lumbar back pain post-fall, pain 4-5/10 at rest, 8/10 at worst, exacerbated by activity, relieved by rest. - Prescribed pain medication as needed. Take  with food. - Prescribed muscle relaxant as needed. Encounter for hepatitis C screening test for low risk patient Check lab Need for hepatitis B screening test Check lab      Return in about 4 months (around 05/29/2024) for physical.  Patient was given opportunity to ask questions. Patient verbalized understanding of the plan and was able to repeat key elements of the plan. All questions were answered to their satisfaction.    I, Bruna Creighton, NP, have reviewed all documentation for this visit. The documentation on 02/08/2024 for the exam, diagnosis, procedures, and orders are all accurate and complete.    IF YOU HAVE BEEN REFERRED TO A SPECIALIST, IT MAY TAKE 1-2 WEEKS TO SCHEDULE/PROCESS THE REFERRAL. IF YOU HAVE NOT HEARD FROM US /SPECIALIST IN TWO WEEKS, PLEASE GIVE US  A CALL AT [phone].

## 2024-01-30 LAB — CBC WITH DIFFERENTIAL/PLATELET
Basophils Absolute: 0.1 x10E3/uL (ref 0.0–0.2)
Basos: 1 %
EOS (ABSOLUTE): 0.3 x10E3/uL (ref 0.0–0.4)
Eos: 2 %
Hematocrit: 48.6 % (ref 37.5–51.0)
Hemoglobin: 14.9 g/dL (ref 13.0–17.7)
Immature Grans (Abs): 0 x10E3/uL (ref 0.0–0.1)
Immature Granulocytes: 0 %
Lymphocytes Absolute: 1.4 x10E3/uL (ref 0.7–3.1)
Lymphs: 13 %
MCH: 22.8 pg — ABNORMAL LOW (ref 26.6–33.0)
MCHC: 30.7 g/dL — ABNORMAL LOW (ref 31.5–35.7)
MCV: 74 fL — ABNORMAL LOW (ref 79–97)
Monocytes Absolute: 0.7 x10E3/uL (ref 0.1–0.9)
Monocytes: 6 %
Neutrophils Absolute: 8.7 x10E3/uL — ABNORMAL HIGH (ref 1.4–7.0)
Neutrophils: 78 %
Platelets: 340 x10E3/uL (ref 150–450)
RBC: 6.53 x10E6/uL — ABNORMAL HIGH (ref 4.14–5.80)
RDW: 16.5 % — ABNORMAL HIGH (ref 11.6–15.4)
WBC: 11.2 x10E3/uL — ABNORMAL HIGH (ref 3.4–10.8)

## 2024-01-30 LAB — COMPREHENSIVE METABOLIC PANEL WITH GFR
ALT: 23 IU/L (ref 0–44)
AST: 21 IU/L (ref 0–40)
Albumin: 4.9 g/dL (ref 4.1–5.1)
Alkaline Phosphatase: 113 IU/L (ref 47–123)
BUN/Creatinine Ratio: 9 (ref 9–20)
BUN: 9 mg/dL (ref 6–24)
Bilirubin Total: 0.5 mg/dL (ref 0.0–1.2)
CO2: 24 mmol/L (ref 20–29)
Calcium: 9.4 mg/dL (ref 8.7–10.2)
Chloride: 102 mmol/L (ref 96–106)
Creatinine, Ser: 0.96 mg/dL (ref 0.76–1.27)
Globulin, Total: 2.8 g/dL (ref 1.5–4.5)
Glucose: 83 mg/dL (ref 70–99)
Potassium: 4.5 mmol/L (ref 3.5–5.2)
Sodium: 140 mmol/L (ref 134–144)
Total Protein: 7.7 g/dL (ref 6.0–8.5)
eGFR: 100 mL/min/1.73 (ref >59)

## 2024-01-30 LAB — HEPATITIS C ANTIBODY: Hep C Virus Ab: NONREACTIVE

## 2024-01-30 LAB — HEPATITIS B SURFACE ANTIBODY,QUALITATIVE: Hep B Surface Ab, Qual: REACTIVE

## 2024-02-10 ENCOUNTER — Encounter (INDEPENDENT_AMBULATORY_CARE_PROVIDER_SITE_OTHER): Payer: Self-pay

## 2024-03-09 ENCOUNTER — Institutional Professional Consult (permissible substitution) (INDEPENDENT_AMBULATORY_CARE_PROVIDER_SITE_OTHER): Admitting: Physician Assistant

## 2024-03-18 NOTE — Progress Notes (Unsigned)
 "  Referring Physician:  Petrina Pries, NP [address] [place],  [place] [address]  Primary Physician:  Petrina Pries, NP  Interpreter used as patient speaks Montagnard Jarai.   History of Present Illness: 03/18/2024*** Michael Frank has a history of HTN.   He had fall off roof 05/02/23- diagnosed with SDH, concussion, and compression fractures of T12 and L1. Was admitted in [place] and initially seen by neurosurery (Dr. Vannie). Placed in TLSO brace.   Had follow up with neurosurgery in [place] on 05/26/23. He was to continue to wear TLSO brace and follow up with neurosurgery in [place] in 6 weeks. Appears he was lost to follow up?***  He was seen by UC on 09/3023 for neck pain and tinnitus. Saw PCP on 01/29/24 and was sent to ENT for tinnitus.   He is here for follow up.         [place]lemens of the roof in March 2025. Dizziness off and on, Ringing left ear. Lumbar compression fracture and thoracic T12, L1 level Wearing TLSO Brace  Did he see neurosurgeon in [place]? (I was not able to get in touch with patient over the phone to ask)  Neck pain Back pain  Duration: *** Location: *** Quality: *** Severity: ***  Precipitating: aggravated by *** Modifying factors: made better by *** Weakness: none Timing: ***  Tobacco use: Does not smoke.   Bowel/Bladder Dysfunction: none  Conservative measures:  Physical therapy: *** has not participated in ? Multimodal medical therapy including regular antiinflammatories: *** Tylenol, Robaxin , Voltaren , Ibuprofen , Tizanidine  Injections: *** no epidural steroid injections  Past Surgery: ***no spine surgery  Michael Frank has ***no symptoms of cervical myelopathy.  The symptoms are causing a significant impact on the patient's life.   Review of Systems:  A 10 point review of systems is negative, except for the pertinent positives and negatives detailed in the HPI.  Past Medical History: Past Medical History:  Diagnosis  Date   Constipation     Past Surgical History: No past surgical history on file.  Allergies: Allergies as of 03/28/2024   (No Known Allergies)    Medications: Outpatient Encounter Medications as of 03/28/2024  Medication Sig   diclofenac  (VOLTAREN ) 75 MG EC tablet Take 1 tablet (75 mg total) by mouth daily as needed.   methocarbamol  (ROBAXIN ) 500 MG tablet Take 1 tablet (500 mg total) by mouth daily as needed for muscle spasms.   No facility-administered encounter medications on file as of 03/28/2024.    Social History: Social History[1]  Family Medical History: No family history on file.  Physical Examination: There were no vitals filed for this visit.  General: Patient is well developed, well nourished, calm, collected, and in no apparent distress. Attention to examination is appropriate.  Respiratory: Patient is breathing without any difficulty.   NEUROLOGICAL:     Awake, alert, oriented to person, place, and time.  Speech is clear and fluent. Fund of knowledge is appropriate.   Cranial Nerves: Pupils equal round and reactive to light.  Facial tone is symmetric.    *** ROM of cervical spine *** pain *** posterior cervical tenderness. *** tenderness in bilateral trapezial region.   *** ROM of lumbar spine *** pain *** posterior lumbar tenderness.   No abnormal lesions on exposed skin.   Strength: Side Biceps Triceps Deltoid Interossei Grip Wrist Ext. Wrist Flex.  R 5 5 5 5 5 5 5   L 5 5 5 5 5 5 5    Side  Iliopsoas Quads Hamstring PF DF EHL  R 5 5 5 5 5 5   L 5 5 5 5 5 5    Reflexes are ***2+ and symmetric at the biceps, brachioradialis, patella and achilles.   Hoffman's is absent.  Clonus is not present.   Bilateral upper and lower extremity sensation is intact to light touch.     Gait is normal.   ***No difficulty with tandem gait.    Medical Decision Making  Imaging: Lumbar xrays dated 05/26/23:  FINDINGS:   There are 5 nonrib-bearing lumbar-type  vertebral bodies with preservation of the lumbar alignment. There is mild anterior wedging involving the T11, T12, L1, and L2 vertebral bodies. There is disc space narrowing with osteophyte formation and facet arthropathy as well as pars defects at L5-S1. Sacroiliac joints are symmetric. Mild to moderate amount of stool throughout the colon.   IMPRESSION  1. Mild anterior wedging of the T12 and L1 vertebral bodies. This is not significantly changed from the CT from 05/02/2023.  2.  Additional mild anterior wedging of the T11 and L2 vertebral bodies.  3.  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   Michael ONEIDA Curt, MD 05/26/2023 4:32 PM   I have personally reviewed the images and agree with the above interpretation.  Assessment and Plan: Michael Frank is a pleasant 44 y.o. male has ***  Treatment options discussed with patient and following plan made:   - Order for physical therapy for *** spine ***. Patient to call to schedule appointment. *** - Continue current medications including ***. Reviewed dosing and side effects.  - Prescription for ***. Reviewed dosing and side effects. Take with food.  - Prescription for *** to take prn muscle spasms. Reviewed dosing and side effects. Discussed this can cause drowsiness.  - MRI of *** to further evaluate *** radiculopathy. No improvement time or medications (***).  - Referral to PM[facility] at [facility] to discuss possible *** injections.  - Will schedule phone visit to review MRI results once I get them back.   I spent a total of *** minutes in face-to-face and non-face-to-face activities related to this patient's care today including review of outside records, review of imaging, review of symptoms, physical exam, discussion of differential diagnosis, discussion of treatment options, and documentation.   Thank you for involving me in the care of this patient.   Michael Boys PA-C Dept. of Neurosurgery      [1]  Social History Tobacco Use    Smoking status: Never    Passive exposure: Never   Smokeless tobacco: Never  Vaping Use   Vaping status: Never Used  Substance Use Topics   Alcohol use: Yes    Comment: occasionally   Drug use: No   "

## 2024-03-22 ENCOUNTER — Inpatient Hospital Stay
Admission: RE | Admit: 2024-03-22 | Discharge: 2024-03-22 | Disposition: A | Payer: Self-pay | Source: Ambulatory Visit | Attending: Orthopedic Surgery | Admitting: Orthopedic Surgery

## 2024-03-22 ENCOUNTER — Other Ambulatory Visit: Payer: Self-pay

## 2024-03-22 DIAGNOSIS — Z049 Encounter for examination and observation for unspecified reason: Secondary | ICD-10-CM

## 2024-03-23 ENCOUNTER — Encounter (INDEPENDENT_AMBULATORY_CARE_PROVIDER_SITE_OTHER): Payer: Self-pay

## 2024-03-28 ENCOUNTER — Ambulatory Visit: Admitting: Orthopedic Surgery

## 2024-03-31 ENCOUNTER — Ambulatory Visit: Admitting: Physician Assistant

## 2024-04-07 ENCOUNTER — Ambulatory Visit: Admitting: Physician Assistant

## 2024-06-02 ENCOUNTER — Encounter: Admitting: Family Medicine
